# Patient Record
Born: 1962 | Race: White | Hispanic: No | ZIP: 274 | Smoking: Never smoker
Health system: Southern US, Community
[De-identification: ages and names within clinical notes are randomized; demographics above are authoritative.]

## PROBLEM LIST (undated history)

## (undated) DIAGNOSIS — K5792 Diverticulitis of intestine, part unspecified, without perforation or abscess without bleeding: Secondary | ICD-10-CM

## (undated) HISTORY — PX: ABDOMINAL HYSTERECTOMY: SHX81

## (undated) HISTORY — PX: APPENDECTOMY: SHX54

## (undated) HISTORY — PX: BACK SURGERY: SHX140

## (undated) HISTORY — PX: TOE SURGERY: SHX1073

---

## 2000-12-06 ENCOUNTER — Other Ambulatory Visit: Admission: RE | Admit: 2000-12-06 | Discharge: 2000-12-06 | Payer: Self-pay | Admitting: Family Medicine

## 2001-07-02 ENCOUNTER — Encounter: Payer: Self-pay | Admitting: Family Medicine

## 2001-07-02 ENCOUNTER — Encounter: Admission: RE | Admit: 2001-07-02 | Discharge: 2001-07-02 | Payer: Self-pay | Admitting: Family Medicine

## 2002-06-04 ENCOUNTER — Encounter: Admission: RE | Admit: 2002-06-04 | Discharge: 2002-06-04 | Payer: Self-pay | Admitting: Family Medicine

## 2002-06-04 ENCOUNTER — Encounter: Payer: Self-pay | Admitting: Family Medicine

## 2002-07-10 ENCOUNTER — Encounter: Payer: Self-pay | Admitting: Family Medicine

## 2002-07-10 ENCOUNTER — Encounter: Admission: RE | Admit: 2002-07-10 | Discharge: 2002-07-10 | Payer: Self-pay | Admitting: Family Medicine

## 2002-07-17 ENCOUNTER — Other Ambulatory Visit: Admission: RE | Admit: 2002-07-17 | Discharge: 2002-07-17 | Payer: Self-pay | Admitting: Family Medicine

## 2002-09-16 ENCOUNTER — Ambulatory Visit (HOSPITAL_BASED_OUTPATIENT_CLINIC_OR_DEPARTMENT_OTHER): Admission: RE | Admit: 2002-09-16 | Discharge: 2002-09-16 | Payer: Self-pay | Admitting: Family Medicine

## 2003-12-08 ENCOUNTER — Ambulatory Visit: Payer: Self-pay | Admitting: Family Medicine

## 2003-12-16 ENCOUNTER — Encounter: Admission: RE | Admit: 2003-12-16 | Discharge: 2003-12-16 | Payer: Self-pay | Admitting: Family Medicine

## 2004-01-22 ENCOUNTER — Ambulatory Visit: Payer: Self-pay | Admitting: Family Medicine

## 2004-01-29 ENCOUNTER — Other Ambulatory Visit: Admission: RE | Admit: 2004-01-29 | Discharge: 2004-01-29 | Payer: Self-pay | Admitting: Family Medicine

## 2004-01-29 ENCOUNTER — Ambulatory Visit: Payer: Self-pay | Admitting: Family Medicine

## 2004-03-25 ENCOUNTER — Ambulatory Visit: Payer: Self-pay | Admitting: Family Medicine

## 2004-04-08 ENCOUNTER — Encounter: Admission: RE | Admit: 2004-04-08 | Discharge: 2004-04-08 | Payer: Self-pay | Admitting: Family Medicine

## 2004-05-20 ENCOUNTER — Ambulatory Visit: Payer: Self-pay | Admitting: Family Medicine

## 2004-07-21 ENCOUNTER — Ambulatory Visit: Payer: Self-pay | Admitting: Family Medicine

## 2004-08-19 ENCOUNTER — Ambulatory Visit: Payer: Self-pay | Admitting: Internal Medicine

## 2004-09-14 ENCOUNTER — Ambulatory Visit: Payer: Self-pay | Admitting: Internal Medicine

## 2004-11-04 ENCOUNTER — Ambulatory Visit: Payer: Self-pay | Admitting: Family Medicine

## 2004-11-15 ENCOUNTER — Ambulatory Visit: Payer: Self-pay | Admitting: Family Medicine

## 2004-11-23 ENCOUNTER — Ambulatory Visit: Payer: Self-pay | Admitting: Family Medicine

## 2005-01-13 ENCOUNTER — Ambulatory Visit: Payer: Self-pay | Admitting: Family Medicine

## 2005-03-17 ENCOUNTER — Ambulatory Visit: Payer: Self-pay | Admitting: Family Medicine

## 2005-04-04 ENCOUNTER — Ambulatory Visit: Payer: Self-pay | Admitting: Family Medicine

## 2005-05-31 ENCOUNTER — Encounter: Admission: RE | Admit: 2005-05-31 | Discharge: 2005-05-31 | Payer: Self-pay | Admitting: Family Medicine

## 2005-06-20 ENCOUNTER — Encounter: Admission: RE | Admit: 2005-06-20 | Discharge: 2005-06-20 | Payer: Self-pay | Admitting: Family Medicine

## 2005-11-14 ENCOUNTER — Ambulatory Visit: Payer: Self-pay | Admitting: Family Medicine

## 2005-11-17 ENCOUNTER — Ambulatory Visit: Payer: Self-pay | Admitting: Family Medicine

## 2005-11-17 LAB — CONVERTED CEMR LAB
ALT: 16 units/L (ref 0–40)
AST: 19 units/L (ref 0–37)
Albumin: 4.1 g/dL (ref 3.5–5.2)
Alkaline Phosphatase: 47 units/L (ref 39–117)
BUN: 12 mg/dL (ref 6–23)
Basophils Absolute: 0.1 10*3/uL (ref 0.0–0.1)
Basophils Relative: 0.8 % (ref 0.0–1.0)
CO2: 25 meq/L (ref 19–32)
Calcium: 9.3 mg/dL (ref 8.4–10.5)
Chloride: 105 meq/L (ref 96–112)
Creatinine, Ser: 0.7 mg/dL (ref 0.4–1.2)
Eosinophil percent: 1.4 % (ref 0.0–5.0)
Ferritin: 9 ng/mL — ABNORMAL LOW (ref 10.0–291.0)
Free T4: 0.7 ng/dL — ABNORMAL LOW (ref 0.9–1.8)
GFR calc non Af Amer: 97 mL/min
Glomerular Filtration Rate, Af Am: 117 mL/min/{1.73_m2}
Glucose, Bld: 119 mg/dL — ABNORMAL HIGH (ref 70–99)
HCT: 37 % (ref 36.0–46.0)
Hemoglobin: 12.5 g/dL (ref 12.0–15.0)
Iron: 106 ug/dL (ref 42–145)
Lymphocytes Relative: 32.3 % (ref 12.0–46.0)
MCHC: 33.9 g/dL (ref 30.0–36.0)
MCV: 88.7 fL (ref 78.0–100.0)
Monocytes Absolute: 0.3 10*3/uL (ref 0.2–0.7)
Monocytes Relative: 3.9 % (ref 3.0–11.0)
Neutro Abs: 4.6 10*3/uL (ref 1.4–7.7)
Neutrophils Relative %: 61.6 % (ref 43.0–77.0)
Platelets: 289 10*3/uL (ref 150–400)
Potassium: 3.3 meq/L — ABNORMAL LOW (ref 3.5–5.1)
RBC: 4.17 M/uL (ref 3.87–5.11)
RDW: 12.1 % (ref 11.5–14.6)
Saturation Ratios: 31 % (ref 20.0–50.0)
Sodium: 136 meq/L (ref 135–145)
T3, Free: 3 pg/mL (ref 2.3–4.2)
TSH: 0.89 microintl units/mL (ref 0.35–5.50)
Total Bilirubin: 0.7 mg/dL (ref 0.3–1.2)
Total Protein: 7.5 g/dL (ref 6.0–8.3)
Transferrin: 244.4 mg/dL (ref >=212.0)
WBC: 7.6 10*3/uL (ref 4.5–10.5)

## 2005-11-29 ENCOUNTER — Ambulatory Visit: Payer: Self-pay | Admitting: Gastroenterology

## 2005-12-13 ENCOUNTER — Ambulatory Visit: Payer: Self-pay | Admitting: Family Medicine

## 2006-02-14 ENCOUNTER — Ambulatory Visit: Payer: Self-pay | Admitting: Family Medicine

## 2006-02-14 LAB — CONVERTED CEMR LAB
ALT: 23 units/L (ref 0–40)
AST: 23 units/L (ref 0–37)
Albumin: 3.7 g/dL (ref 3.5–5.2)
Alkaline Phosphatase: 41 units/L (ref 39–117)
Bilirubin, Direct: 0.1 mg/dL (ref 0.0–0.3)
Total Bilirubin: 0.6 mg/dL (ref 0.3–1.2)
Total Protein: 6.9 g/dL (ref 6.0–8.3)

## 2006-02-27 ENCOUNTER — Encounter: Payer: Self-pay | Admitting: Family Medicine

## 2006-02-27 ENCOUNTER — Ambulatory Visit: Payer: Self-pay | Admitting: Family Medicine

## 2006-02-27 ENCOUNTER — Other Ambulatory Visit: Admission: RE | Admit: 2006-02-27 | Discharge: 2006-02-27 | Payer: Self-pay | Admitting: Family Medicine

## 2006-05-02 ENCOUNTER — Ambulatory Visit: Payer: Self-pay | Admitting: Family Medicine

## 2006-07-10 ENCOUNTER — Ambulatory Visit: Payer: Self-pay | Admitting: Family Medicine

## 2006-07-10 DIAGNOSIS — J40 Bronchitis, not specified as acute or chronic: Secondary | ICD-10-CM | POA: Insufficient documentation

## 2006-08-15 ENCOUNTER — Encounter: Admission: RE | Admit: 2006-08-15 | Discharge: 2006-08-15 | Payer: Self-pay | Admitting: Family Medicine

## 2006-08-17 ENCOUNTER — Encounter (INDEPENDENT_AMBULATORY_CARE_PROVIDER_SITE_OTHER): Payer: Self-pay | Admitting: *Deleted

## 2006-10-17 ENCOUNTER — Ambulatory Visit: Payer: Self-pay | Admitting: Family Medicine

## 2006-10-17 DIAGNOSIS — I10 Essential (primary) hypertension: Secondary | ICD-10-CM

## 2006-10-26 ENCOUNTER — Ambulatory Visit: Payer: Self-pay | Admitting: Internal Medicine

## 2006-10-26 ENCOUNTER — Ambulatory Visit: Payer: Self-pay | Admitting: Family Medicine

## 2006-10-26 ENCOUNTER — Telehealth (INDEPENDENT_AMBULATORY_CARE_PROVIDER_SITE_OTHER): Payer: Self-pay | Admitting: *Deleted

## 2006-10-26 DIAGNOSIS — R109 Unspecified abdominal pain: Secondary | ICD-10-CM | POA: Insufficient documentation

## 2006-10-26 LAB — CONVERTED CEMR LAB
ALT: 18 units/L (ref 0–35)
AST: 16 units/L (ref 0–37)
Albumin: 3.9 g/dL (ref 3.5–5.2)
Alkaline Phosphatase: 73 units/L (ref 39–117)
Amylase: 46 units/L (ref 27–131)
Basophils Absolute: 0.1 10*3/uL (ref 0.0–0.1)
Basophils Relative: 0.6 % (ref 0.0–1.0)
Bilirubin Urine: NEGATIVE
Bilirubin Urine: NEGATIVE
Bilirubin, Direct: 0.1 mg/dL (ref 0.0–0.3)
Eosinophils Absolute: 0.1 10*3/uL (ref 0.0–0.6)
Eosinophils Relative: 1.1 % (ref 0.0–5.0)
Glucose, Urine, Semiquant: NEGATIVE
HCT: 37.2 % (ref 36.0–46.0)
Hemoglobin, Urine: NEGATIVE
Hemoglobin: 12.7 g/dL (ref 12.0–15.0)
Ketones, ur: NEGATIVE mg/dL
Ketones, urine, test strip: NEGATIVE
Leukocytes, UA: NEGATIVE
Lipase: 23 units/L (ref 11.0–59.0)
Lymphocytes Relative: 18.7 % (ref 12.0–46.0)
MCHC: 34.1 g/dL (ref 30.0–36.0)
MCV: 88.6 fL (ref 78.0–100.0)
Monocytes Absolute: 0.8 10*3/uL — ABNORMAL HIGH (ref 0.2–0.7)
Monocytes Relative: 6.5 % (ref 3.0–11.0)
Neutro Abs: 8.4 10*3/uL — ABNORMAL HIGH (ref 1.4–7.7)
Neutrophils Relative %: 73.1 % (ref 43.0–77.0)
Nitrite: NEGATIVE
Nitrite: NEGATIVE
Platelets: 344 10*3/uL (ref 150–400)
Protein, U semiquant: NEGATIVE
Protein, ur: NEGATIVE mg/dL
RBC: 4.2 M/uL (ref 3.87–5.11)
RDW: 12.3 % (ref 11.5–14.6)
Specific Gravity, Urine: 1.025
Specific Gravity, Urine: 1.007 (ref 1.005–1.03)
Total Bilirubin: 1 mg/dL (ref 0.3–1.2)
Total Protein: 7.4 g/dL (ref 6.0–8.3)
Urine Glucose: NEGATIVE mg/dL
Urobilinogen, UA: NEGATIVE
Urobilinogen, UA: 0.2 (ref 0.0–1.0)
WBC Urine, dipstick: NEGATIVE
WBC: 11.6 10*3/uL — ABNORMAL HIGH (ref 4.5–10.5)
pH: 6
pH: 6.5 (ref 5.0–8.0)

## 2006-10-27 ENCOUNTER — Encounter (INDEPENDENT_AMBULATORY_CARE_PROVIDER_SITE_OTHER): Payer: Self-pay | Admitting: Family Medicine

## 2006-11-23 IMAGING — MG MM DIGITAL DIAGNOSTIC LIMITED*R*
2 series · 2 of 2 positions shown · non-contrast
Comparison: none

[REDACTED] RIGHT
CC and MLO view(s) were taken of the right breast.

DIGITAL LIMITED RIGHT DIAGNOSTIC MAMMOGRAM:
CLINICAL DATA: Abnormal screening mammogram.
Comparison studies are dated 04-08-04 and 07-10-02.  Additional views today reveal no persistent mass
or distortion within the upper right breast.  The fibroglandular parenchymal pattern is stable.

[R MLO]
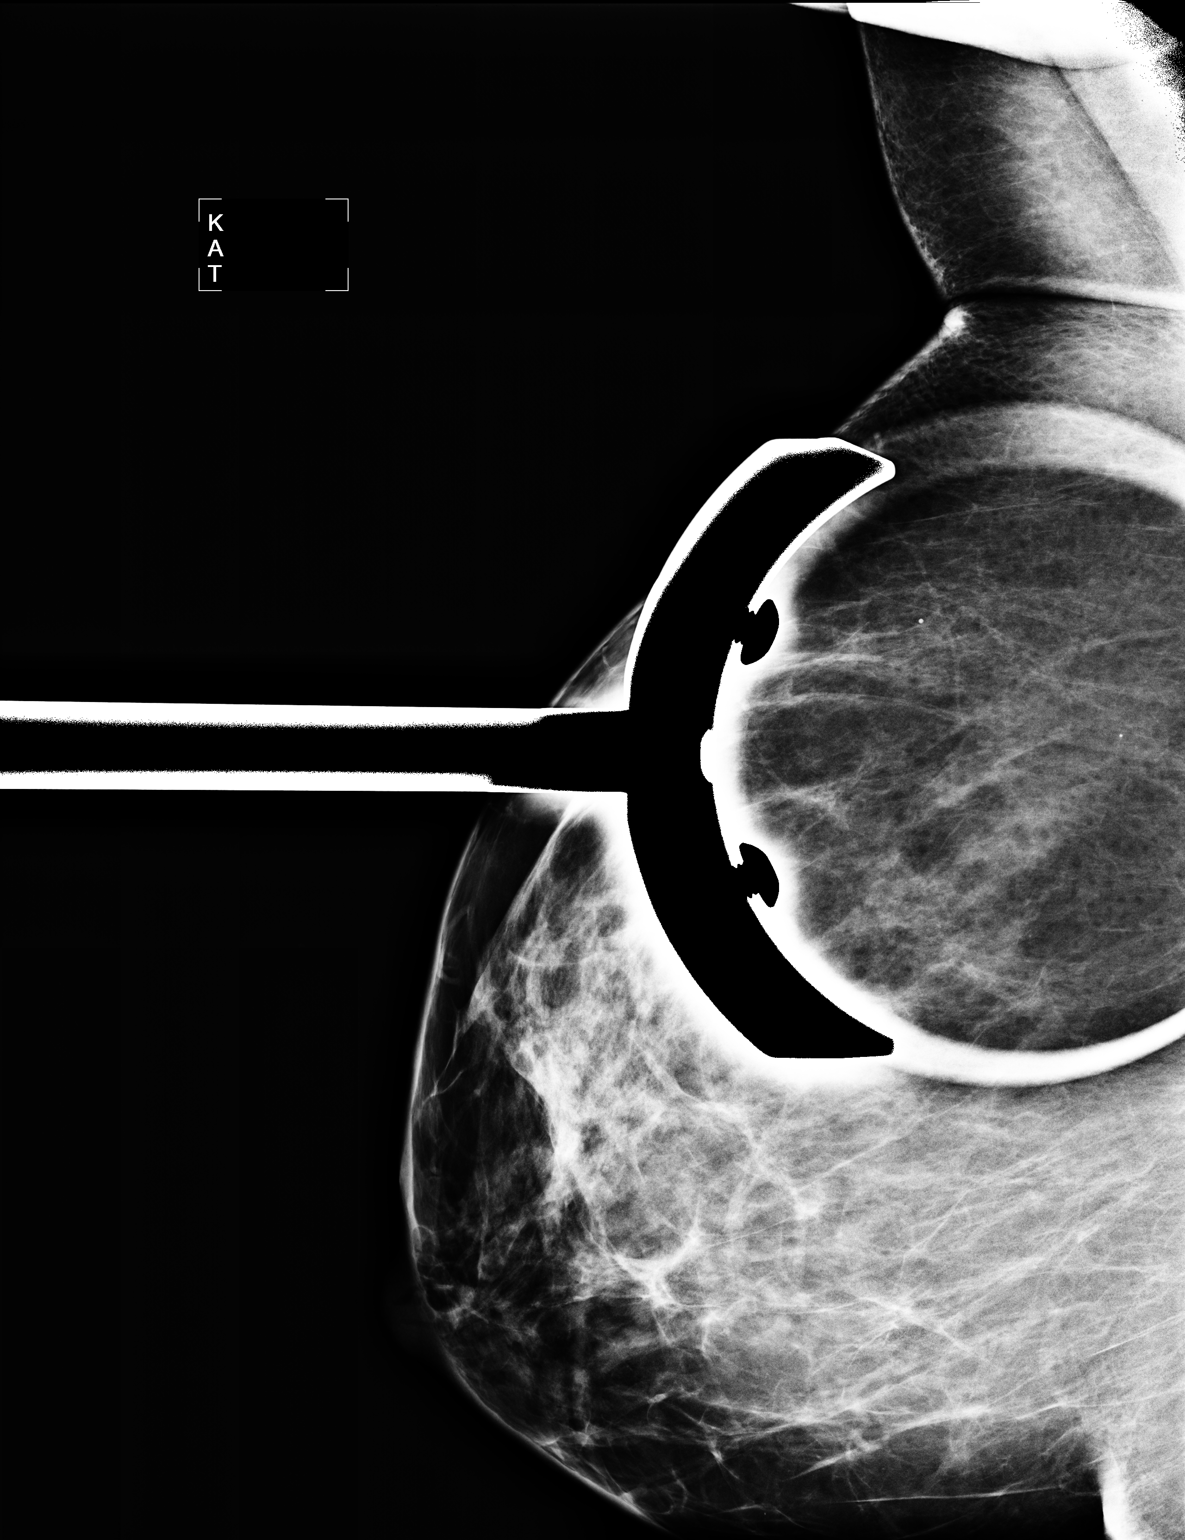

[R ML]
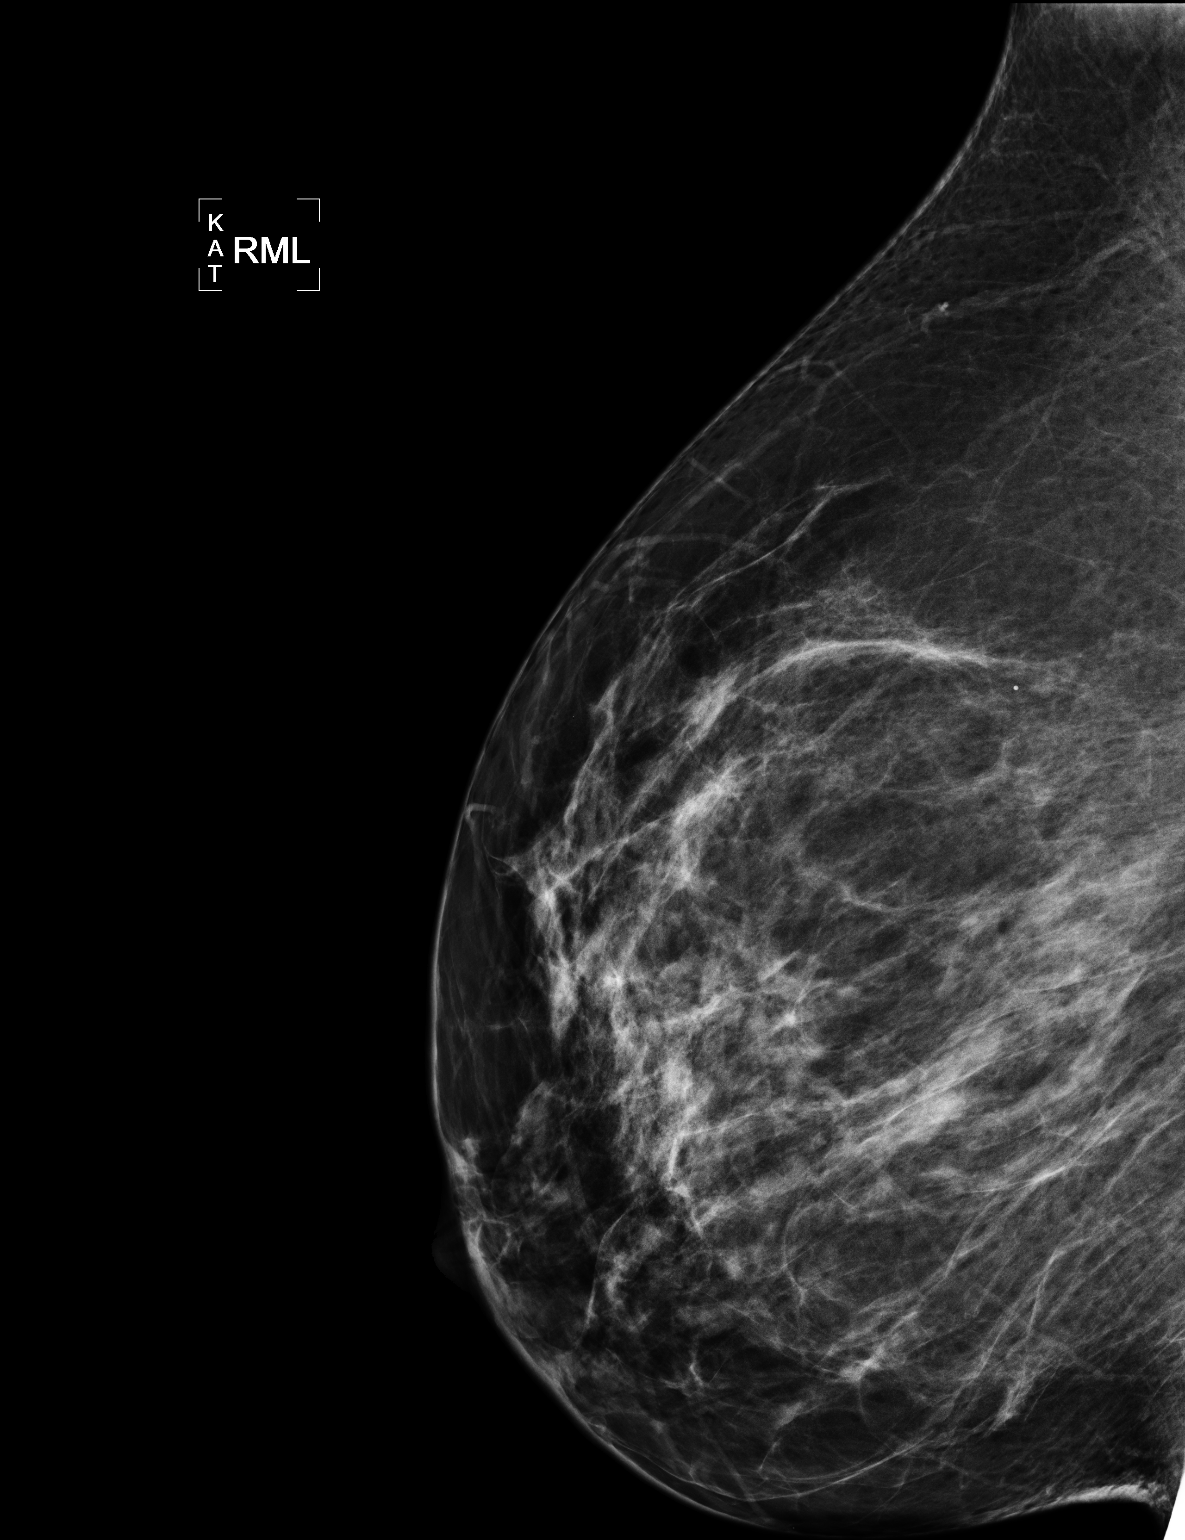

[2 of 2 positions shown; findings below may reference images not displayed]

IMPRESSION: There is no specific radiographic evidence of malignancy on the right.  Screening mammogram in one 
year is recommended.

ASSESSMENT: Negative - BI-RADS 1

Screening mammogram of both breasts in 1 year.
, THIS PROCEDURE WAS A DIGITAL MAMMOGRAM.

## 2006-11-27 ENCOUNTER — Ambulatory Visit: Payer: Self-pay | Admitting: Gastroenterology

## 2006-11-29 ENCOUNTER — Telehealth (INDEPENDENT_AMBULATORY_CARE_PROVIDER_SITE_OTHER): Payer: Self-pay | Admitting: *Deleted

## 2007-01-14 ENCOUNTER — Encounter (HOSPITAL_COMMUNITY): Payer: Self-pay | Admitting: Obstetrics and Gynecology

## 2007-01-14 ENCOUNTER — Ambulatory Visit (HOSPITAL_COMMUNITY): Admission: AD | Admit: 2007-01-14 | Discharge: 2007-01-15 | Payer: Self-pay | Admitting: Obstetrics and Gynecology

## 2007-02-13 ENCOUNTER — Ambulatory Visit: Payer: Self-pay | Admitting: Family Medicine

## 2007-02-13 DIAGNOSIS — R05 Cough: Secondary | ICD-10-CM

## 2007-02-18 ENCOUNTER — Ambulatory Visit: Payer: Self-pay | Admitting: Family Medicine

## 2007-02-18 DIAGNOSIS — M79609 Pain in unspecified limb: Secondary | ICD-10-CM | POA: Insufficient documentation

## 2007-02-19 ENCOUNTER — Ambulatory Visit: Payer: Self-pay | Admitting: Family Medicine

## 2007-02-19 ENCOUNTER — Telehealth (INDEPENDENT_AMBULATORY_CARE_PROVIDER_SITE_OTHER): Payer: Self-pay | Admitting: *Deleted

## 2007-02-20 ENCOUNTER — Encounter (INDEPENDENT_AMBULATORY_CARE_PROVIDER_SITE_OTHER): Payer: Self-pay | Admitting: *Deleted

## 2007-02-20 LAB — CONVERTED CEMR LAB: Uric Acid, Serum: 4.5 mg/dL (ref 2.4–7.0)

## 2007-03-18 DIAGNOSIS — K5730 Diverticulosis of large intestine without perforation or abscess without bleeding: Secondary | ICD-10-CM | POA: Insufficient documentation

## 2007-03-18 DIAGNOSIS — F329 Major depressive disorder, single episode, unspecified: Secondary | ICD-10-CM

## 2007-03-18 DIAGNOSIS — R519 Headache, unspecified: Secondary | ICD-10-CM | POA: Insufficient documentation

## 2007-03-18 DIAGNOSIS — F3289 Other specified depressive episodes: Secondary | ICD-10-CM | POA: Insufficient documentation

## 2007-03-18 DIAGNOSIS — R51 Headache: Secondary | ICD-10-CM

## 2007-07-05 ENCOUNTER — Telehealth (INDEPENDENT_AMBULATORY_CARE_PROVIDER_SITE_OTHER): Payer: Self-pay | Admitting: *Deleted

## 2010-02-13 ENCOUNTER — Encounter: Payer: Self-pay | Admitting: Family Medicine

## 2010-06-07 NOTE — Assessment & Plan Note (Signed)
[facility]                                 ON-CALL NOTE   NAME:[name], Beth                          MRN:          [id number]  DATE:10/27/2006                            DOB:          1962/09/29    PHONE NUMBER:  [phone].   COMPLAINT:  Lower stomach pain.   PROGRESS NOTE:  The patient has been diagnosed with diverticulitis  yesterday. She had had a CT scan but does not think there is any abscess  on it. She is not running a fever but having some nausea and some right  lower quadrant pain. She knows it is not her appendix because she has  had that removed. She was started on Ciprofloxacin and Flagyl that has  not yet been 24 hours since she started the medication. She is also  planning a hysterectomy, upcoming for a prolapsed uterus but has no  other gynecologic problems. The patient is wanting to know what she  could safely take for pain. I told her that she could take some Tylenol  but to avoid anti-inflammatories like Ibuprofen, as they can upset the  stomach further. She is going to use a warm compress on her abdomen. I  instructed her that if her pain increases or if she develops fever, she  needs to go to the emergency room for further evaluation. Otherwise,  call back if any questions and if she is not improving by Monday, she  will definitely call Dr. Laqueta Linden office.     [name], MD  Electronically Signed    MAT/MedQ  DD: 10/27/2006  DT: 10/27/2006  Job #: [id number]   cc:   Leanne Chang, M.D.

## 2010-06-07 NOTE — Assessment & Plan Note (Signed)
[facility]                         GASTROENTEROLOGY OFFICE NOTE   NAME:Beth Velez, Beth Velez                        MRN:          [id number]  DATE:11/27/2006                            DOB:          06/11/62    PROBLEM:  Abdominal pain.   Ms. Bove has returned for evaluation of right lower quadrant pain.  Approximately three weeks ago, she was seen at [facility] for  severe right lower quadrant pain.  CT demonstrated changes consistent  with acute diverticulitis.  She was treated with Cipro and Flagyl with  relief of her pain over several days.  She now reports erratic bowels  with episodes of diarrhea alternating with constipation.  She has seen a  small amount of blood as well.  She is status post appendectomy.  She  has no prior lower GI complaints.  She was seen approximately one year  ago in this office for left upper quadrant but failed to follow up.  This has basically resolved.   MEDICATIONS:  Protonix, lisinopril, Celexa, Lamictal, Lipitor, and  Imitrex.   She has no allergies.   PHYSICAL EXAMINATION:  Pulse 66, blood pressure 100/60, weight 202.  HEENT: EOMI.  PERRLA.  Sclerae are anicteric.  Conjunctivae are pink.  NECK:  Supple without thyromegaly, adenopathy or carotid bruits.  CHEST:  Clear to auscultation and percussion without adventitious  sounds.  CARDIAC:  Regular rhythm; normal S1 S2.  There are no murmurs, gallops  or rubs.  ABDOMEN:  Bowel sounds are normoactive.  Abdomen is soft, nontender and  nondistended.  There are no abdominal masses, tenderness, splenic  enlargement or hepatomegaly.  EXTREMITIES:  Full range of motion.  No cyanosis, clubbing or edema.  RECTAL:  Deferred.   CT scan on October 26, 2006 demonstrated mild pericolonic inflammatory  changes.  This is felt to represent an early diverticulitis.   IMPRESSION:  Questionable history of acute diverticulitis.  Other forms  of colitis must be considered,  including Crohn's disease.   RECOMMENDATION:  Colonoscopy.     Barbette Hair. Arlyce Dice, MD,FACG  Electronically Signed    RDK/MedQ  DD: 11/27/2006  DT: 11/28/2006  Job #: [id number]   cc:   Lelon Perla, DO

## 2010-06-07 NOTE — Op Note (Signed)
Beth Velez, SPIKE                 ACCOUNT NO.:  [id number]   MEDICAL RECORD NO.:  [id number]          PATIENT TYPE:  OIB   LOCATION:  9316                          FACILITY:  [facility]   PHYSICIAN:  Zelphia Cairo, MD    DATE OF BIRTH:  1962/07/01   DATE OF PROCEDURE:  01/14/2007  DATE OF DISCHARGE:  01/15/2007                               OPERATIVE REPORT   PREOPERATIVE DIAGNOSES:  1. Dysmenorrhea.  2. Uterine prolapse.   PREOPERATIVE DIAGNOSES:  1. Dysmenorrhea.  2. Uterine prolapse.   PROCEDURE:  Laparoscopic-assisted vaginal hysterectomy with bilateral  salpingo-oophorectomy.   SURGEON:  Zelphia Cairo, M.D.   ASSISTANT:  Duke Salvia. Marcelle Overlie, M.D.   ESTIMATED BLOOD LOSS:  250 cc.   URINE OUTPUT:  300 cc.   ANESTHESIA:  General.   SPECIMENS:  Uterus, cervix, bilateral tubes and ovaries to pathology.   COMPLICATIONS:  None.   CONDITION:  Patient stable, transferred to recovery room.   PROCEDURE:  The patient is taken to the operating room where general  anesthesia was obtained.  She was prepped and draped in a sterile  fashion.  A Foley catheter was inserted sterilely.  A bivalve speculum  was placed in the vagina.  A single-tooth tenaculum was placed on the  anterior lip of the cervix and a Hulka uterine manipulator was placed on  the cervix.  The tenaculum and speculum were then removed.   A small infraumbilical skin incision was made with the scalpel, and this  was extended to the fascia bluntly using Kelly clamp.  An optical trocar  was then used to enter the peritoneal cavity under direct visualization.  Once intraperitoneal placement was confirmed, CO2 was turned on, and the  abdomen and pelvis were infiltrated.  The patient was then placed in  Trendelenburg position.  A small suprapubic incision was made with a  scalpel, and a 5 mm trocar was inserted under direct visualization.  A  blunt probe was then placed.  The bowel was swept  out of the posterior  cul  de sac.  The bilateral tubes and ovaries were inspected.  An  atraumatic grasper was then used to grasp the right adnexa.  The right  ureter was visualized and found to be clear of our operating field.  The  right utero-ovarian ligament was cauterized and cut using the gyrus.  Serial bites with the gyrus were continued along the level of the  mesosalpinx and the broad ligament adjacent to the uterus.  The round  ligament was cauterized using the gyrus and cut.  This procedure was  repeated on the left adnexa.  Excellent hemostasis was noted  bilaterally, and our attention was then turned to the vagina.  Hulka  clamp was removed.   The weighted speculum was placed in the posterior vagina.  A Deaver was  placed anteriorly.  The cervix was grasped with a tenaculum.  A  circumferential incision was made around the cervix using the Bovie.  The posterior cul de sac was then entered sharply using Mayo scissors.  Once intraperitoneal entry was confirmed, a long  weighted speculum was  placed in the posterior cul de sac.  Bilateral uterosacral ligaments  were grasped with the Ligasure, cauterized, and cut with Mayo scissors.  Hemostasis was assured bilaterally.  The anterior cul de sac was then  tented upwards and entered with Metzenbaum scissors.  A Deaver retractor  was then placed in the anterior cul de sac.  Bilateral cardinal  ligaments and uterine arteries were cauterized with the Ligasure, cut  with Mayo scissors.  Hemostasis was assured bilaterally.  The fundus of  the uterus was then grasped using a tenaculum and delivered through the  posterior cul de sac.  The remaining broad ligament attachments were  grasped bilaterally with curved Heaney clamps, cut with Mayo scissors.  These were suture ligated using 0 Vicryl.  Once hemostasis was assured,  a 0 Vicryl was used to reapproximate the posterior vaginal cuff to the  posterior peritoneum in a running, locked fashion.  The vaginal cuff  was  then closed using serial figure-of-eight suture.  The vaginal cuff was  well approximated and hemostatic.  The cuff was felt to be well-  supported and at the level of the ischial spine; therefore, sacrospinous  ligament suspension was not performed.  Our attention was then returned  to the abdomen.   The CO2 was turned back on, and an inspection of the pelvic cavity was  performed.  The pelvis was irrigated.  All pedicles were found to be  hemostatic.  All instruments and trocars were removed from the abdomen.  The fascia of the infraumbilical skin incision was closed using Vicryl,  and the skin was reapproximated using 3-0 Vicryl.  The patient tolerated  the procedure well.  Sponge, lap, needle, and instrument counts were  correct x2.  She was taken to the recovery room in stable condition.      Zelphia Cairo, MD  Electronically Signed     GA/MEDQ  D:  01/15/2007  T:  01/15/2007  Job:  [id number]

## 2010-06-10 NOTE — Assessment & Plan Note (Signed)
[facility]                           GASTROENTEROLOGY OFFICE NOTE   NAME:Beth Velez, Beth Velez                        MRN:          [id number]  DATE:11/29/2005                            DOB:          08-12-1962    REASON FOR CONSULTATION:  Abdominal pain.   HISTORY OF PRESENT ILLNESS:  Beth Velez is a pleasant 48 year old white  female referred through the courtesy of Dr. Laury Axon for evaluation.  Over the  last year, she has been complaining of intermittent, sharp, left upper  quadrant pain.  Pain is intense, though it usually lasts minutes at a time.  It tends to be postprandially.  It does not awaken her.  She denies nausea  or vomiting.  She does have pyrosis which is well controlled with Protonix.  She has had no gastric irritants including nonsteroidals.  She also has very  intermittent crampy abdominal pain with diarrhea.  This is not related to  her upper abdominal pain.   PAST MEDICAL HISTORY:  1. Depression.  2. Chronic headaches.  3. Hypercholesterolemia.  4. Status post tubal ligation and appendectomy.  5. History of noncontributory.   MEDICATIONS:  1. Protonix.  2. Effexor.  3. Vytorin.   ALLERGIES:  No known drug allergies.   SOCIAL HISTORY:  She neither smokes nor drinks.  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   REVIEW OF SYSTEMS:  Reviewed and is negative.   PHYSICAL EXAMINATION:  VITAL SIGNS:  Pulse 76, blood pressure 130/80, weight  199.  HEENT: EOMI. PERRLA. Sclerae are anicteric.  Conjunctivae are pink.  NECK:  Supple without thyromegaly, adenopathy or carotid bruits.  CHEST:  Clear to auscultation and percussion without adventitious sounds.  CARDIAC:  A 2/6 early systolic murmur in the left sternal border.  ABDOMEN:  Bowel sounds are normoactive.  Abdomen is soft, non-tender and non-  distended.  There are no abdominal masses, tenderness, splenic enlargement  or hepatomegaly.  EXTREMITIES:  Full range of  motion.  No cyanosis, clubbing or edema.  RECTAL:  Deferred.   IMPRESSION:  1. Intermittent, sharp, left upper quadrant pain.  This pain seems to be      more spastic.  An active gastric lesion is less likely in the face of      PPI therapy and the longevity of her symptoms.  Though, I believe      examination is indicated.  2. Gastroesophageal reflux disease.   RECOMMENDATION:  1. Upper endoscopy.  2. Continue Protonix.  3. NuLev 0.25 mg sublingual q.4 h p.r.n. pain.     Barbette Hair. Arlyce Dice, MD,FACG  Electronically Signed    RDK/MedQ  DD: 11/29/2005  DT: 11/29/2005  Job #: [id number]

## 2010-06-10 NOTE — Letter (Signed)
November 29, 2005    Lelon Perla, DO  [address] [place], [place] [address]   RE:  KIANDRA, SANGUINETTI  MRN:  [id number]  /  DOB:  04-Nov-1962   Dear Dr. Laury Axon:   Upon your kind referral, I had the pleasure of evaluating your patient and I  am pleased to offer my findings.  I saw Lurie Mullane. Trenkamp in the office today.  Enclosed is a copy of my progress note that details my findings and  recommendations.   Thank you for the opportunity to participate in your patient's care.    Sincerely,      Barbette Hair. Arlyce Dice, MD,FACG  Electronically Signed    RDK/MedQ  DD: 11/29/2005  DT: 11/29/2005  Job #: [id number]

## 2010-06-10 NOTE — Letter (Signed)
November 29, 2005    Margy Sumler  [address]  [place], [place] [address]   RE:  QUINTESSA, SIMMERMAN  MRN:  [id number]  /  DOB:  03/22/62   Dear Ms. Burget:   It is my pleasure to have treated you recently as a new patient in my  office.  I appreciate your confidence and the opportunity to participate in  your care.   Since I do have a busy inpatient endoscopy schedule and office schedule, my  office hours vary weekly.  I am, however, available for emergency calls  every day through my office.  If I cannot promptly meet an urgent office  appointment, another one of our gastroenterologists will be able to assist  you.   My well-trained staff are prepared to help you at all times.  For  emergencies after office hours, a physician from our gastroenterology  section is always available through my 24-hour answering service.   While you are under my care, I encourage discussion of your questions and  concerns, and I will be happy to return your calls as soon as I am  available.   Once again, I welcome you as a new patient and I look forward to a happy and  healthy relationship.    Sincerely,      Barbette Hair. Arlyce Dice, MD,FACG  Electronically Signed    RDK/MedQ  DD: 11/29/2005  DT: 11/29/2005  Job #: [id number]

## 2010-10-28 LAB — CBC
HCT: 27.5 — ABNORMAL LOW
HCT: 34 — ABNORMAL LOW
Hemoglobin: 11.7 — ABNORMAL LOW
Hemoglobin: 9.6 — ABNORMAL LOW
MCHC: 34.4
MCHC: 35
MCV: 89
MCV: 89.3
Platelets: 282
Platelets: 325
RBC: 3.09 — ABNORMAL LOW
RBC: 3.82 — ABNORMAL LOW
RDW: 12.8
RDW: 12.9
WBC: 11 — ABNORMAL HIGH
WBC: 8.8

## 2010-10-28 LAB — TYPE AND SCREEN
ABO/RH(D): A POS
Antibody Screen: NEGATIVE

## 2010-10-28 LAB — ABO/RH: ABO/RH(D): A POS

## 2016-02-11 ENCOUNTER — Encounter (HOSPITAL_BASED_OUTPATIENT_CLINIC_OR_DEPARTMENT_OTHER): Payer: Self-pay

## 2016-02-11 ENCOUNTER — Emergency Department (HOSPITAL_BASED_OUTPATIENT_CLINIC_OR_DEPARTMENT_OTHER)
Admission: EM | Admit: 2016-02-11 | Discharge: 2016-02-11 | Disposition: A | Discharge status: Home / Self Care | Payer: Medicaid Other | Attending: Dermatology | Admitting: Dermatology

## 2016-02-11 DIAGNOSIS — Z79899 Other long term (current) drug therapy: Secondary | ICD-10-CM | POA: Insufficient documentation

## 2016-02-11 DIAGNOSIS — Z5321 Procedure and treatment not carried out due to patient leaving prior to being seen by health care provider: Secondary | ICD-10-CM | POA: Insufficient documentation

## 2016-02-11 DIAGNOSIS — R1030 Lower abdominal pain, unspecified: Secondary | ICD-10-CM | POA: Insufficient documentation

## 2016-02-11 DIAGNOSIS — R109 Unspecified abdominal pain: Secondary | ICD-10-CM | POA: Diagnosis present

## 2016-02-11 HISTORY — DX: Diverticulitis of intestine, part unspecified, without perforation or abscess without bleeding: K57.92

## 2016-02-11 NOTE — ED Triage Notes (Signed)
C/o lower abd pain x today-states feels like diverticulitis-denies n/v/d-NAD-steady gait

## 2019-07-06 ENCOUNTER — Encounter (HOSPITAL_BASED_OUTPATIENT_CLINIC_OR_DEPARTMENT_OTHER): Payer: Self-pay

## 2019-07-06 ENCOUNTER — Emergency Department (HOSPITAL_BASED_OUTPATIENT_CLINIC_OR_DEPARTMENT_OTHER)
Admission: EM | Admit: 2019-07-06 | Discharge: 2019-07-06 | Discharge status: Against Medical Advice | Payer: Medicare HMO | Attending: Emergency Medicine | Admitting: Emergency Medicine

## 2019-07-06 ENCOUNTER — Other Ambulatory Visit: Payer: Self-pay

## 2019-07-06 ENCOUNTER — Emergency Department (HOSPITAL_BASED_OUTPATIENT_CLINIC_OR_DEPARTMENT_OTHER): Payer: Medicare HMO

## 2019-07-06 DIAGNOSIS — Z5321 Procedure and treatment not carried out due to patient leaving prior to being seen by health care provider: Secondary | ICD-10-CM | POA: Diagnosis not present

## 2019-07-06 DIAGNOSIS — R0602 Shortness of breath: Secondary | ICD-10-CM | POA: Diagnosis not present

## 2019-07-06 LAB — BASIC METABOLIC PANEL
Anion gap: 11 (ref 5–15)
BUN: 12 mg/dL (ref 6–20)
CO2: 23 mmol/L (ref 22–32)
Calcium: 9.2 mg/dL (ref 8.9–10.3)
Chloride: 103 mmol/L (ref 98–111)
Creatinine, Ser: 0.86 mg/dL (ref 0.44–1.00)
GFR calc Af Amer: >60 mL/min (ref >60)
GFR calc non Af Amer: >60 mL/min (ref >60)
Glucose, Bld: 96 mg/dL (ref 70–99)
Potassium: 3.4 mmol/L — ABNORMAL LOW (ref 3.5–5.1)
Sodium: 137 mmol/L (ref 135–145)

## 2019-07-06 LAB — CBC
HCT: 37.6 % (ref 36.0–46.0)
Hemoglobin: 12.6 g/dL (ref 12.0–15.0)
MCH: 30.5 pg (ref 26.0–34.0)
MCHC: 33.5 g/dL (ref 30.0–36.0)
MCV: 91 fL (ref 80.0–100.0)
Platelets: 346 10*3/uL (ref 150–400)
RBC: 4.13 MIL/uL (ref 3.87–5.11)
RDW: 12.6 % (ref 11.5–15.5)
WBC: 7.8 10*3/uL (ref 4.0–10.5)
nRBC: 0 % (ref 0.0–0.2)

## 2019-07-06 LAB — TROPONIN I (HIGH SENSITIVITY): Troponin I (High Sensitivity): 2 ng/L (ref <18)

## 2019-07-06 NOTE — ED Notes (Signed)
Per registration, pt left without being seen after triage.

## 2019-07-06 NOTE — ED Triage Notes (Signed)
Pt arrives with c/o SOB since Wednesday reports falling earlier that week, states she has been fatigued and feeling bloated, with some pain in her left upper chest.

## 2019-07-14 ENCOUNTER — Other Ambulatory Visit: Payer: Self-pay

## 2019-07-14 ENCOUNTER — Encounter (HOSPITAL_BASED_OUTPATIENT_CLINIC_OR_DEPARTMENT_OTHER): Payer: Self-pay | Admitting: *Deleted

## 2019-07-14 ENCOUNTER — Emergency Department (HOSPITAL_BASED_OUTPATIENT_CLINIC_OR_DEPARTMENT_OTHER): Payer: Medicare HMO

## 2019-07-14 ENCOUNTER — Emergency Department (HOSPITAL_BASED_OUTPATIENT_CLINIC_OR_DEPARTMENT_OTHER)
Admission: EM | Admit: 2019-07-14 | Discharge: 2019-07-14 | Disposition: A | Discharge status: Home / Self Care | Payer: Medicare HMO | Attending: Emergency Medicine | Admitting: Emergency Medicine

## 2019-07-14 DIAGNOSIS — I10 Essential (primary) hypertension: Secondary | ICD-10-CM | POA: Diagnosis not present

## 2019-07-14 DIAGNOSIS — R5383 Other fatigue: Secondary | ICD-10-CM | POA: Diagnosis present

## 2019-07-14 DIAGNOSIS — R0602 Shortness of breath: Secondary | ICD-10-CM

## 2019-07-14 DIAGNOSIS — E876 Hypokalemia: Secondary | ICD-10-CM | POA: Insufficient documentation

## 2019-07-14 DIAGNOSIS — R0789 Other chest pain: Secondary | ICD-10-CM

## 2019-07-14 LAB — BASIC METABOLIC PANEL
Anion gap: 10 (ref 5–15)
BUN: 10 mg/dL (ref 6–20)
CO2: 24 mmol/L (ref 22–32)
Calcium: 9.4 mg/dL (ref 8.9–10.3)
Chloride: 105 mmol/L (ref 98–111)
Creatinine, Ser: 0.77 mg/dL (ref 0.44–1.00)
GFR calc Af Amer: >60 mL/min (ref >60)
GFR calc non Af Amer: >60 mL/min (ref >60)
Glucose, Bld: 125 mg/dL — ABNORMAL HIGH (ref 70–99)
Potassium: 3 mmol/L — ABNORMAL LOW (ref 3.5–5.1)
Sodium: 139 mmol/L (ref 135–145)

## 2019-07-14 LAB — CBC
HCT: 39.2 % (ref 36.0–46.0)
Hemoglobin: 12.9 g/dL (ref 12.0–15.0)
MCH: 30.5 pg (ref 26.0–34.0)
MCHC: 32.9 g/dL (ref 30.0–36.0)
MCV: 92.7 fL (ref 80.0–100.0)
Platelets: 355 10*3/uL (ref 150–400)
RBC: 4.23 MIL/uL (ref 3.87–5.11)
RDW: 12.5 % (ref 11.5–15.5)
WBC: 6.7 10*3/uL (ref 4.0–10.5)
nRBC: 0 % (ref 0.0–0.2)

## 2019-07-14 LAB — TROPONIN I (HIGH SENSITIVITY): Troponin I (High Sensitivity): 2 ng/L (ref <18)

## 2019-07-14 LAB — D-DIMER, QUANTITATIVE: D-Dimer, Quant: 0.31 ug/mL-FEU (ref 0.00–0.50)

## 2019-07-14 MED ORDER — POTASSIUM CHLORIDE CRYS ER 20 MEQ PO TBCR
40.0000 meq | EXTENDED_RELEASE_TABLET | Freq: Once | ORAL | Status: AC
  Administered 2019-07-14: 40 meq via ORAL
  Filled 2019-07-14: qty 2

## 2019-07-14 NOTE — Discharge Instructions (Addendum)
Your work up today is reassuring. Your labs show a mild hypokalemia, you were treated with a potassium pill in the ER. Your chest x-ray looks good. Your EKG shows non specific changes in general, unchanged from your last ER visit. Recommend follow up with cardiology for further work up, referral given. Return to the ER for any worsening or concerning symptoms.

## 2019-07-14 NOTE — ED Provider Notes (Addendum)
[facility] EMERGENCY DEPARTMENT Provider Note   CSN: [id number] Arrival date & time: 07/14/19  1013     History Chief Complaint  Patient presents with  . Shortness of Breath    Beth Velez is a 57 y.o. female.  57yo female with complaint of fatigue, shortness of breath, chest pain, abdominal discomfort.  Fatigue onset early June, thought possibly related to depression, no changes to medication for depression.  6/8 with horrible SHOB, intermittent  Chest pain onset around 6/11 aching mid sternal to left side chest  Saw PCP 6/15 with left breast pain- aching, worse with wearing a bra, no longer having pain in the breast. Was scheduled for a mammogram today, canceled due to coming to the ER.   Abdominal pain, worse with irritation from waist band on pants.  Had a COVID test, resulted today and was negative.  Felt like had a heart attack 2 days ago 07/12/19, looked up heart attack symptoms in women which prompted ER visit.  Chest pain and SHOB not as bad today. Chest pain is constant, severity waxes and wanes, aching in nature, worse with nothing. Does not improve with taking off bra or pants (felt restrictive), not worse with exertion, radiates to left armpit/neck (for the past few days).  No prior heart testing. High cholesterol, history of HTN this year, reports due to stress, taking amlodipine. No history of diabetes. Father with MI, not diagnosed until in his 24s, no significant heart history prior to age 43.        Past Medical History:  Diagnosis Date  . Diverticulitis     Patient Active Problem List   Diagnosis Date Noted  . DEPRESSION 03/18/2007  . DIVERTICULAR DISEASE 03/18/2007  . HEADACHE, CHRONIC 03/18/2007  . FOOT PAIN, LEFT 02/18/2007  . COUGH 02/13/2007  . ABDOMINAL PAIN, ACUTE 10/26/2006  . HYPERTENSION 10/17/2006  . BRONCHITIS NOS 07/10/2006    Past Surgical History:  Procedure Laterality Date  . ABDOMINAL HYSTERECTOMY    .  APPENDECTOMY    . BACK SURGERY       OB History    Gravida  2   Para  2   Term      Preterm      AB      Living        SAB      TAB      Ectopic      Multiple      Live Births              History reviewed. No pertinent family history.  Social History   Tobacco Use  . Smoking status: Never Smoker  . Smokeless tobacco: Never Used  Substance Use Topics  . Alcohol use: No  . Drug use: No    Home Medications Prior to Admission medications   Medication Sig Start Date End Date Taking? Authorizing Provider  clonazePAM (KLONOPIN) 1 MG tablet Take 1 mg by mouth 2 (two) times daily as needed for anxiety.   Yes [provider]  Desvenlafaxine Succinate (PRISTIQ PO) Take by mouth.   Yes [provider]  Estradiol (ESTRACE PO) Take by mouth.   Yes [provider]  LamoTRIgine (LAMICTAL PO) Take by mouth.   Yes [provider]  Pantoprazole Sodium (PROTONIX PO) Take by mouth.   Yes [provider]    Allergies    Patient has no known allergies.  Review of Systems   Review of Systems  Constitutional: Positive  for fatigue. Negative for diaphoresis and fever.  Respiratory: Positive for shortness of breath.   Cardiovascular: Positive for chest pain. Negative for palpitations and leg swelling.  Gastrointestinal: Positive for abdominal pain and nausea. Negative for vomiting.  Genitourinary: Negative for dysuria.  Musculoskeletal: Positive for arthralgias.  Skin: Negative for rash and wound.  Neurological: Negative for weakness.  Hematological: Negative for adenopathy.  Psychiatric/Behavioral: Negative for confusion.  All other systems reviewed and are negative.   Physical Exam Updated Vital Signs BP (!) 129/92 (BP Location: Right Arm)   Pulse 79   Temp 98.3 F (36.8 C) (Oral)   Resp 16   SpO2 100%   Physical Exam Vitals and nursing note reviewed.  Constitutional:      General: She is not in acute distress.     Appearance: She is well-developed. She is not diaphoretic.  HENT:     Head: Normocephalic and atraumatic.  Cardiovascular:     Rate and Rhythm: Normal rate and regular rhythm.  Pulmonary:     Effort: Pulmonary effort is normal.     Breath sounds: Normal breath sounds. No decreased breath sounds.  Abdominal:     Palpations: Abdomen is soft.     Tenderness: There is no abdominal tenderness.  Musculoskeletal:     Right lower leg: No tenderness.     Left lower leg: No tenderness.  Neurological:     Mental Status: She is alert and oriented to person, place, and time.  Psychiatric:        Behavior: Behavior normal.     ED Results / Procedures / Treatments   Labs (all labs ordered are listed, but only abnormal results are displayed) Labs Reviewed  BASIC METABOLIC PANEL - Abnormal; Notable for the following components:      Result Value   Potassium 3.0 (*)    Glucose, Bld 125 (*)    All other components within normal limits  CBC  D-DIMER, QUANTITATIVE (NOT AT [facility])  TROPONIN I (HIGH SENSITIVITY)    EKG None  Radiology No results found.  Procedures Procedures (including critical care time)  Medications Ordered in ED Medications  potassium chloride SA (KLOR-CON) CR tablet 40 mEq (40 mEq Oral Given 07/14/19 1232)    ED Course  I have reviewed the triage vital signs and the nursing notes.  Pertinent labs & imaging results that were available during my care of the patient were reviewed by me and considered in my medical decision making (see chart for details).  Clinical Course as of Jul 28 1443  Mon Jul 14, 2019  1123 ECG per my interpretation with NSR, similar repolarization abnormalities as noted on 6/14 ecg, no STEMI    [MT]  1656 57 year old female with complaints as listed above.  On exam, patient is well-appearing, she is mildly tachycardic with clear lung sounds and abdomen is soft and nontender, no lower extremity edema. CBC within normal limits, BMP with mild  hypokalemia with potassium 3.0, treated with oral potassium prior to discharge.  Initial troponin is 2, no significant EKG change from EKG obtained 1 week ago, with chest discomfort ongoing for greater than 1 week, doubt ACS however EKG with early repolarization and no prior EKGs before this month available for comparison, plan is to refer to cardiology for further work-up, given strict return to ER precautions.  D-dimer is negative, doubt PE. Case discussed with Dr. Langston Masker, ER attending, agrees with plan of care.    [LM]    Clinical Course User Index [  LM] Jeannie Fend, PA-C [MT] Renaye Rakers Kermit Balo, MD   MDM Rules/Calculators/A&P                          Final Clinical Impression(s) / ED Diagnoses Final diagnoses:  Shortness of breath  Discomfort in chest  Hypokalemia    Rx / DC Orders ED Discharge Orders    None       Alden Hipp 07/14/19 1301    Terald Sleeper, MD 07/14/19 1746    Jeannie Fend, PA-C 07/28/19 1445    Terald Sleeper, MD 07/29/19 1306

## 2019-07-14 NOTE — ED Triage Notes (Signed)
Patient stated that she was here on the 13th and left after being triage but was not seen by EDP.  Shortness of breath and chest pain since June the 8th.  Patient also stated that she had a heart attack 2 days ago (self diagnosed), extreme pain on her left chest pain, radiating to her left armpit and extreme fatigue.

## 2019-07-25 ENCOUNTER — Ambulatory Visit: Payer: Medicare HMO | Admitting: Cardiology

## 2019-08-22 ENCOUNTER — Ambulatory Visit: Payer: Medicare HMO | Admitting: Cardiology

## 2019-09-05 ENCOUNTER — Encounter: Payer: Self-pay | Admitting: General Practice

## 2021-04-06 ENCOUNTER — Observation Stay (HOSPITAL_BASED_OUTPATIENT_CLINIC_OR_DEPARTMENT_OTHER)
Admission: EM | Admit: 2021-04-06 | Discharge: 2021-04-07 | Disposition: A | Discharge status: Home / Self Care | Payer: Medicare HMO | Attending: Family Medicine | Admitting: Family Medicine

## 2021-04-06 ENCOUNTER — Observation Stay (HOSPITAL_COMMUNITY): Payer: Medicare HMO

## 2021-04-06 ENCOUNTER — Other Ambulatory Visit: Payer: Self-pay

## 2021-04-06 ENCOUNTER — Emergency Department (HOSPITAL_BASED_OUTPATIENT_CLINIC_OR_DEPARTMENT_OTHER): Payer: Medicare HMO

## 2021-04-06 ENCOUNTER — Encounter (HOSPITAL_BASED_OUTPATIENT_CLINIC_OR_DEPARTMENT_OTHER): Payer: Self-pay | Admitting: Emergency Medicine

## 2021-04-06 DIAGNOSIS — H5340 Unspecified visual field defects: Secondary | ICD-10-CM | POA: Diagnosis present

## 2021-04-06 DIAGNOSIS — G459 Transient cerebral ischemic attack, unspecified: Secondary | ICD-10-CM | POA: Diagnosis not present

## 2021-04-06 DIAGNOSIS — I10 Essential (primary) hypertension: Secondary | ICD-10-CM | POA: Diagnosis not present

## 2021-04-06 DIAGNOSIS — H532 Diplopia: Secondary | ICD-10-CM | POA: Diagnosis not present

## 2021-04-06 DIAGNOSIS — R42 Dizziness and giddiness: Secondary | ICD-10-CM | POA: Insufficient documentation

## 2021-04-06 DIAGNOSIS — Z79899 Other long term (current) drug therapy: Secondary | ICD-10-CM | POA: Diagnosis not present

## 2021-04-06 LAB — CBC WITH DIFFERENTIAL/PLATELET
Abs Immature Granulocytes: 0.02 10*3/uL (ref 0.00–0.07)
Basophils Absolute: 0.1 10*3/uL (ref 0.0–0.1)
Basophils Relative: 1 %
Eosinophils Absolute: 0.1 10*3/uL (ref 0.0–0.5)
Eosinophils Relative: 2 %
HCT: 40.6 % (ref 36.0–46.0)
Hemoglobin: 13.4 g/dL (ref 12.0–15.0)
Immature Granulocytes: 0 %
Lymphocytes Relative: 39 %
Lymphs Abs: 2.5 10*3/uL (ref 0.7–4.0)
MCH: 30 pg (ref 26.0–34.0)
MCHC: 33 g/dL (ref 30.0–36.0)
MCV: 90.8 fL (ref 80.0–100.0)
Monocytes Absolute: 0.4 10*3/uL (ref 0.1–1.0)
Monocytes Relative: 6 %
Neutro Abs: 3.3 10*3/uL (ref 1.7–7.7)
Neutrophils Relative %: 52 %
Platelets: 402 10*3/uL — ABNORMAL HIGH (ref 150–400)
RBC: 4.47 MIL/uL (ref 3.87–5.11)
RDW: 12.8 % (ref 11.5–15.5)
WBC: 6.4 10*3/uL (ref 4.0–10.5)
nRBC: 0 % (ref 0.0–0.2)

## 2021-04-06 LAB — COMPREHENSIVE METABOLIC PANEL
ALT: 17 U/L (ref 0–44)
AST: 18 U/L (ref 15–41)
Albumin: 4.4 g/dL (ref 3.5–5.0)
Alkaline Phosphatase: 52 U/L (ref 38–126)
Anion gap: 10 (ref 5–15)
BUN: 9 mg/dL (ref 6–20)
CO2: 23 mmol/L (ref 22–32)
Calcium: 9.6 mg/dL (ref 8.9–10.3)
Chloride: 104 mmol/L (ref 98–111)
Creatinine, Ser: 0.82 mg/dL (ref 0.44–1.00)
GFR, Estimated: >60 mL/min (ref >60)
Glucose, Bld: 97 mg/dL (ref 70–99)
Potassium: 3.6 mmol/L (ref 3.5–5.1)
Sodium: 137 mmol/L (ref 135–145)
Total Bilirubin: 0.5 mg/dL (ref 0.3–1.2)
Total Protein: 7.7 g/dL (ref 6.5–8.1)

## 2021-04-06 LAB — CBC
HCT: 38.7 % (ref 36.0–46.0)
Hemoglobin: 12.5 g/dL (ref 12.0–15.0)
MCH: 29.7 pg (ref 26.0–34.0)
MCHC: 32.3 g/dL (ref 30.0–36.0)
MCV: 91.9 fL (ref 80.0–100.0)
Platelets: 364 10*3/uL (ref 150–400)
RBC: 4.21 MIL/uL (ref 3.87–5.11)
RDW: 12.8 % (ref 11.5–15.5)
WBC: 7.4 10*3/uL (ref 4.0–10.5)
nRBC: 0 % (ref 0.0–0.2)

## 2021-04-06 LAB — CREATININE, SERUM
Creatinine, Ser: 0.81 mg/dL (ref 0.44–1.00)
GFR, Estimated: >60 mL/min (ref >60)

## 2021-04-06 LAB — HIV ANTIBODY (ROUTINE TESTING W REFLEX): HIV Screen 4th Generation wRfx: NONREACTIVE

## 2021-04-06 MED ORDER — IOHEXOL 350 MG/ML SOLN
75.0000 mL | Freq: Once | INTRAVENOUS | Status: AC | PRN
  Administered 2021-04-06: 75 mL via INTRAVENOUS

## 2021-04-06 MED ORDER — BUPROPION HCL ER (XL) 150 MG PO TB24
300.0000 mg | ORAL_TABLET | Freq: Every day | ORAL | Status: DC
  Administered 2021-04-07: 300 mg via ORAL
  Filled 2021-04-06: qty 2

## 2021-04-06 MED ORDER — ENOXAPARIN SODIUM 40 MG/0.4ML IJ SOSY
40.0000 mg | PREFILLED_SYRINGE | INTRAMUSCULAR | Status: DC
  Administered 2021-04-06: 40 mg via SUBCUTANEOUS
  Filled 2021-04-06: qty 0.4

## 2021-04-06 MED ORDER — ACETAMINOPHEN 650 MG RE SUPP: 650.0000 mg | RECTAL | Status: DC | PRN

## 2021-04-06 MED ORDER — CLONAZEPAM 0.5 MG PO TABS: 1.0000 mg | ORAL_TABLET | Freq: Two times a day (BID) | ORAL | Status: DC | PRN

## 2021-04-06 MED ORDER — CLOPIDOGREL BISULFATE 75 MG PO TABS
75.0000 mg | ORAL_TABLET | Freq: Once | ORAL | Status: AC
  Administered 2021-04-06: 75 mg via ORAL
  Filled 2021-04-06: qty 1

## 2021-04-06 MED ORDER — ATORVASTATIN CALCIUM 40 MG PO TABS
40.0000 mg | ORAL_TABLET | Freq: Every day | ORAL | Status: DC
  Filled 2021-04-06: qty 1

## 2021-04-06 MED ORDER — GABAPENTIN 300 MG PO CAPS: 600.0000 mg | ORAL_CAPSULE | Freq: Three times a day (TID) | ORAL | Status: DC

## 2021-04-06 MED ORDER — SENNOSIDES-DOCUSATE SODIUM 8.6-50 MG PO TABS: 1.0000 | ORAL_TABLET | Freq: Every evening | ORAL | Status: DC | PRN

## 2021-04-06 MED ORDER — STROKE: EARLY STAGES OF RECOVERY BOOK
Freq: Once | Status: AC
  Filled 2021-04-06: qty 1

## 2021-04-06 MED ORDER — LISDEXAMFETAMINE DIMESYLATE 70 MG PO CAPS: 70.0000 mg | ORAL_CAPSULE | Freq: Every morning | ORAL | Status: DC

## 2021-04-06 MED ORDER — PANTOPRAZOLE SODIUM 40 MG PO TBEC
40.0000 mg | DELAYED_RELEASE_TABLET | Freq: Two times a day (BID) | ORAL | Status: DC
  Administered 2021-04-06 – 2021-04-07 (×2): 40 mg via ORAL
  Filled 2021-04-06 (×2): qty 1

## 2021-04-06 MED ORDER — LAMOTRIGINE 100 MG PO TABS
200.0000 mg | ORAL_TABLET | Freq: Every day | ORAL | Status: DC
  Administered 2021-04-06: 200 mg via ORAL
  Filled 2021-04-06: qty 2

## 2021-04-06 MED ORDER — SERTRALINE HCL 100 MG PO TABS: 100.0000 mg | ORAL_TABLET | Freq: Every day | ORAL | Status: DC

## 2021-04-06 MED ORDER — ACETAMINOPHEN 160 MG/5ML PO SOLN: 650.0000 mg | ORAL | Status: DC | PRN

## 2021-04-06 MED ORDER — VENLAFAXINE HCL ER 37.5 MG PO CP24: 37.5000 mg | ORAL_CAPSULE | Freq: Every day | ORAL | Status: DC

## 2021-04-06 MED ORDER — ASPIRIN 81 MG PO CHEW
81.0000 mg | CHEWABLE_TABLET | Freq: Once | ORAL | Status: AC
  Administered 2021-04-06: 81 mg via ORAL
  Filled 2021-04-06: qty 1

## 2021-04-06 MED ORDER — LAMOTRIGINE 100 MG PO TABS: 100.0000 mg | ORAL_TABLET | Freq: Every day | ORAL | Status: DC

## 2021-04-06 MED ORDER — ACETAMINOPHEN 325 MG PO TABS: 650.0000 mg | ORAL_TABLET | ORAL | Status: DC | PRN

## 2021-04-06 NOTE — Progress Notes (Signed)
Pt admitted to 3W14.  Alert and oriented.  Tele placed on patient and CCMD called; second verification complete.  Call bell within reach and bed alarm set.  Pt verbalizes understanding to call before attempting to get out of bed. ?

## 2021-04-06 NOTE — ED Notes (Signed)
Pt report given to accepting Nurse French Ana and [facility]. All questions and concerns addressed. ?

## 2021-04-06 NOTE — ED Notes (Signed)
Pt ambulatory to restroom at this time. No assistance needed, steady gate noted. Pt denies vision changes, SHOB or dizziness while getting up or walking. ?

## 2021-04-06 NOTE — Progress Notes (Signed)
Overnight progress note ? ?Notified by pharmacy that lisdexamfetamine, sertraline, clonazepam, venlafaxine XR, and gabapentin were ordered at the time of admission but patient confirmed that she is not taking any of these medications at home.  Instead, patient is currently taking bupropion and lamotrigine at home. ?-Discontinue lisdexamfetamine, sertraline, clonazepam, venlafaxine XR, and gabapentin ?-Continue bupropion and lamotrigine ?

## 2021-04-06 NOTE — ED Provider Notes (Signed)
?[facility] EMERGENCY DEPARTMENT ?Provider Note ? ? ?CSN: [id number] ?Arrival date & time: 04/06/21  1112 ? ?  ? ?History ? ?Chief Complaint  ?Patient presents with  ? Visual Field Change  ? ? ?Beth Velez is a 59 y.o. female. ? ?HPI ? ?  ? ?59 year old female with a history of hypertension, prior lumbar laminectomy for radiculopathy left-sided back pain, for which she had been given a Medrol Dosepak in January, who presents with concern for episodes of double vision and being off balance. ? ?Reports this was the fifth episode she has had. She will have episodes where her eyes cross like double vision and she cannot stand, is very off balance, having to hold onto something to keep from falling over. She is under a significant amount of stress at home.  She denies numbness, focal weakness, facial droop, difficulty talking.  During the episodes she has difficulty walking and the visual changes. ? ?She had 1 episode last month, and had prior episodes very occasionally over the last year.  The episode today was more severe so she came for evaluation.  Reports episode today is 5 minutes, with double vision, feeling of severe dizziness and being off balance. ? ?Denies chest pain, shortness of breath, nausea, vomiting, diarrhea, black or bloody stools, fever, headache. ? ? ? ?Past Medical History:  ?Diagnosis Date  ? Diverticulitis   ?  ? ?Home Medications ?Prior to Admission medications   ?Medication Sig Start Date End Date Taking? Authorizing Provider  ?acetaminophen (TYLENOL) 500 MG tablet Take 1,000 mg by mouth every 6 (six) hours as needed.    [provider]  ?amLODipine (NORVASC) 10 MG tablet Take 10 mg by mouth daily. 05/21/19   [provider]  ?amphetamine-dextroamphetamine (ADDERALL XR) 30 MG 24 hr capsule  06/18/14   [provider]  ?atorvastatin (LIPITOR) 40 MG tablet Take 40 mg by mouth at bedtime. 05/06/19   [provider]  ?clonazePAM (KLONOPIN) 1 MG tablet Take  1 mg by mouth 2 (two) times daily as needed for anxiety.    [provider]  ?desvenlafaxine (PRISTIQ) 100 MG 24 hr tablet Take 100 mg by mouth daily. 07/03/19   [provider]  ?Desvenlafaxine Succinate (PRISTIQ PO) Take by mouth.    [provider]  ?dextroamphetamine (DEXTROSTAT) 10 MG tablet Take 1 tablet by mouth 3 (three) times daily. 07/04/19   [provider]  ?ergocalciferol (VITAMIN D2) 1.25 MG (50000 UT) capsule TK 1 C PO WEEKLY FOR 12 WKS 09/11/14   [provider]  ?Estradiol (ESTRACE PO) Take by mouth.    [provider]  ?estradiol (ESTRACE) 0.5 MG tablet Take 0.5 mg by mouth daily. 06/28/19   [provider]  ?fluticasone (FLONASE) 50 MCG/ACT nasal spray Place 1 spray into both nostrils daily. 05/22/19   [provider]  ?gabapentin (NEURONTIN) 300 MG capsule Take 2 capsules by mouth 3 (three) times daily. 09/17/17   [provider]  ?lamoTRIgine (LAMICTAL) 100 MG tablet Take 100 mg by mouth daily.    [provider]  ?lamoTRIgine (LAMICTAL) 200 MG tablet Take 300 mg by mouth at bedtime. 06/24/19   [provider]  ?pantoprazole (PROTONIX) 40 MG tablet Take 40 mg by mouth 2 (two) times daily. 07/04/19   [provider]  ?REXULTI 2 MG TABS tablet Take 2 mg by mouth at bedtime. 04/23/19   [provider]  ?sertraline (ZOLOFT) 100 MG tablet TAKE 1 TABLET BY MOUTH ONCE  DAILY FOR 7 DAYS AND THEN INCREASE TO 2 TABLETS ONCE DAILY 06/24/19   [provider]  ?VYVANSE 70 MG capsule Take 70 mg by mouth every morning. 06/28/19   [provider]  ?WELLBUTRIN XL 300 MG 24 hr tablet Take 300 mg by mouth every morning. 05/03/19   [provider]  ?   ? ?Allergies    ?Patient has no known allergies.   ? ?Review of Systems   ?Review of Systems ?See above ? ?Physical Exam ?Updated Vital Signs ?BP (!) 156/108   Pulse 97   Temp 99.3 ?F (37.4 ?C) (Oral)   Resp 15   Ht 5\' 4"  (1.626 m)   Wt  77.1 kg   SpO2 100%   BMI 29.18 kg/m?  ?Physical Exam ?Vitals and nursing note reviewed.  ?Constitutional:   ?   General: She is not in acute distress. ?   Appearance: Normal appearance. She is well-developed. She is not ill-appearing or diaphoretic.  ?HENT:  ?   Head: Normocephalic and atraumatic.  ?Eyes:  ?   General: No visual field deficit. ?   Extraocular Movements: Extraocular movements intact.  ?   Conjunctiva/sclera: Conjunctivae normal.  ?   Pupils: Pupils are equal, round, and reactive to light.  ?Cardiovascular:  ?   Rate and Rhythm: Normal rate and regular rhythm.  ?   Pulses: Normal pulses.  ?   Heart sounds: Normal heart sounds. No murmur heard. ?  No friction rub. No gallop.  ?Pulmonary:  ?   Effort: Pulmonary effort is normal. No respiratory distress.  ?   Breath sounds: Normal breath sounds. No wheezing or rales.  ?Abdominal:  ?   General: There is no distension.  ?   Palpations: Abdomen is soft.  ?   Tenderness: There is no abdominal tenderness. There is no guarding.  ?Musculoskeletal:     ?   General: No swelling or tenderness.  ?   Cervical back: Normal range of motion.  ?Skin: ?   General: Skin is warm and dry.  ?   Findings: No erythema or rash.  ?Neurological:  ?   General: No focal deficit present.  ?   Mental Status: She is alert and oriented to person, place, and time.  ?   GCS: GCS eye subscore is 4. GCS verbal subscore is 5. GCS motor subscore is 6.  ?   Cranial Nerves: No cranial nerve deficit, dysarthria or facial asymmetry.  ?   Sensory: No sensory deficit.  ?   Motor: No weakness or tremor.  ?   Coordination: Coordination normal. Finger-Nose-Finger Test normal.  ?   Gait: Gait normal.  ? ? ?ED Results / Procedures / Treatments   ?Labs ?(all labs ordered are listed, but only abnormal results are displayed) ?Labs Reviewed  ?CBC WITH DIFFERENTIAL/PLATELET - Abnormal; Notable for the following components:  ?    Result Value  ? Platelets 402 (*)   ? All other components within normal  limits  ?COMPREHENSIVE METABOLIC PANEL  ? ? ?EKG ?EKG Interpretation ? ?Date/Time:  Wednesday April 06 2021 13:20:03 EDT ?Ventricular Rate:  82 ?PR Interval:  161 ?QRS Duration: 86 ?QT Interval:  414 ?QTC Calculation: 484 ?R Axis:   48 ?Text Interpretation: Sinus rhythm No significant change since last tracing Confirmed by Alvira MondaySchlossman, Samaya Boardley ([id number]) on 04/06/2021 1:28:35 PM ? ?Radiology ?CT ANGIO HEAD NECK W WO CM ? ?Result Date: 04/06/2021 ?CLINICAL DATA:  Neuro deficit, persistent/recurrent, CNS neoplasm suspected EXAM: CT ANGIOGRAPHY  HEAD AND NECK TECHNIQUE: Multidetector CT imaging of the head and neck was performed using the standard protocol during bolus administration of intravenous contrast. Multiplanar CT image reconstructions and MIPs were obtained to evaluate the vascular anatomy. Carotid stenosis measurements (when applicable) are obtained utilizing NASCET criteria, using the distal internal carotid diameter as the denominator. RADIATION DOSE REDUCTION: This exam was performed according to the departmental dose-optimization program which includes automated exposure control, adjustment of the mA and/or kV according to patient size and/or use of iterative reconstruction technique. CONTRAST:  27mL OMNIPAQUE IOHEXOL 350 MG/ML SOLN COMPARISON:  None. FINDINGS: CT HEAD FINDINGS Brain: No evidence of acute large vascular territory infarction, hemorrhage, hydrocephalus, extra-axial collection or mass lesion/mass effect. Partially empty sella. Vascular: Detailed below. Skull: No acute fracture. Sinuses: Visualized sinuses are clear. Orbits: No acute finding. Review of the MIP images confirms the above findings CTA NECK FINDINGS Aortic arch: Great vessel origins are patent without significant stenosis. Right carotid system: No evidence of dissection, stenosis (50% or greater) or occlusion. Left carotid system: No clear evidence of dissection, stenosis (50% or greater) or occlusion. Mild linear appearing filling defect  involving the left ICA near the skull base may be artifactual and is atypical for dissection. Vertebral arteries: Left dominant. No evidence of dissection, stenosis (50% or greater) or occlusion. Artifact lim

## 2021-04-06 NOTE — ED Notes (Signed)
Attempted to call report to accepting Nurse. RN to call back.  ?

## 2021-04-06 NOTE — ED Notes (Signed)
Pt to CT

## 2021-04-06 NOTE — ED Triage Notes (Signed)
Reports sudden onset of "cross eyed" vision".  Says this has happened five different times but never got it checked out before.  Episode lasted 4-5 minutes.  Currently resolved.   ?

## 2021-04-06 NOTE — Progress Notes (Signed)
Originating Facility: [facility] ?Requesting Physician/APP: Alvira Monday, MD ? ?History: ?Beth Velez is a 59 year old female with past medical history significant for HTN, HLD, GERD, vertigo, depression/anxiety, prior lumbar laminectomy who presented to med [facility] with concerns for double vision, vertigo and feeling "off balance".  Per EDP, patient also has been under significant stress recently.  Work-up notable for CT head with no acute findings but with partially empty sella and CT angiogram head with age-indeterminate occlusion of small/nondominant right vertebral artery.  ED physician discussed case with neurology, Dr. Selina Cooley; who recommended admission for TIA work-up. ? ?Plan of Care:  ?Excepted to medical telemetry bed for TIA work-up ?Contact neurology on patient's arrival ? ?[facility] will assume care on arrival to accepting facility. Until arrival, care as per EDP. However, [facility] available 24/7 for questions and assistance. ? ?  ?Please page [facility] Admits and Consults [phone]) as soon as the patient arrives to the hospital.  ? ?Minerva Areola [place], DO ? ?

## 2021-04-06 NOTE — Evaluation (Signed)
Occupational Therapy Evaluation ?Patient Details ?Name: Beth Velez ?MRN: [id number] ?DOB: 05/17/62 ?Today's Date: 04/06/2021 ? ? ?History of Present Illness 59 y.o. female with medical history significant of hypertension, prior lumbar laminectomy for radiculopathy left-sided back pain, hyperlipidemia, hypertension, GERD, depression, anxiety and vertigo presented to [facility] with a complaint of double vision as well as being off balance.  ? ?Clinical Impression ?  ?Patient admitted with transient onset of double vision and unsteadiness.  Patient states all symptoms resolved, and she is essentially at her baseline.  PTA she lives at home with her two grown daughters, and her 31 yo mother.  The patient is currently on disability, and does have chronic back issues.  She is still being worked up for a stroke, and given 5 previous attacks of the same symptoms, OT placed high level goals to follow.  No anticipated OT needs post acute.    ?   ? ?Recommendations for follow up therapy are one component of a multi-disciplinary discharge planning process, led by the attending physician.  Recommendations may be updated based on patient status, additional functional criteria and insurance authorization.  ? ?Follow Up Recommendations ? No OT follow up  ?  ?Assistance Recommended at Discharge None  ?Patient can return home with the following   ? ?  ?Functional Status Assessment ? Patient has not had a recent decline in their functional status  ?Equipment Recommendations ? None recommended by OT  ?  ?Recommendations for Other Services Rehab consult ? ? ?  ?Precautions / Restrictions Precautions ?Precautions: Fall ?Restrictions ?Weight Bearing Restrictions: No  ? ?  ? ?Mobility Bed Mobility ?Overal bed mobility: Independent ?  ?  ?  ?  ?  ?  ?  ?  ? ?Transfers ?Overall transfer level: Independent ?  ?  ?  ?  ?  ?  ?  ?  ?  ?  ? ?  ?Balance Overall balance assessment: No apparent balance deficits (not formally assessed) ?  ?  ?  ?  ?   ?  ?  ?  ?  ?  ?  ?  ?  ?  ?  ?  ?  ?  ?   ? ?ADL either performed or assessed with clinical judgement  ? ?ADL Overall ADL's : At baseline ?  ?  ?  ?  ?  ?  ?  ?  ?  ?  ?  ?  ?  ?  ?  ?  ?  ?  ?  ?   ? ? ? ?Vision Patient Visual Report: No change from baseline ?   ?   ?Perception Perception ?Perception: Within Functional Limits ?  ?Praxis Praxis ?Praxis: Intact ?  ? ?Pertinent Vitals/Pain Pain Assessment ?Pain Assessment: No/denies pain  ? ? ? ?Hand Dominance Right ?  ?Extremity/Trunk Assessment Upper Extremity Assessment ?Upper Extremity Assessment: Overall WFL for tasks assessed ?  ?Lower Extremity Assessment ?Lower Extremity Assessment: Defer to PT evaluation ?  ?Cervical / Trunk Assessment ?Cervical / Trunk Assessment: Normal ?  ?Communication Communication ?Communication: No difficulties ?  ?Cognition Arousal/Alertness: Awake/alert ?Behavior During Therapy: [facility] for tasks assessed/performed ?Overall Cognitive Status: Within Functional Limits for tasks assessed ?  ?  ?  ?  ?  ?  ?  ?  ?  ?  ?  ?  ?  ?  ?  ?  ?General Comments: emotional when talking about her kids ?  ?  ?General Comments   VSS  on RA ? ?  ?Exercises   ?  ?Shoulder Instructions    ? ? ?Home Living Family/patient expects to be discharged to:: Private residence ?Living Arrangements: Children;Parent ?Available Help at Discharge: Family;Available 24 hours/day ?Type of Home: House ?Home Access: Stairs to enter ?  ?  ?Home Layout: Two level;Bed/bath upstairs;Full bath on main level ?Alternate Level Stairs-Number of Steps: 89 yo mother lives on the first floor, full flight to upstairs ?  ?Bathroom Shower/Tub: Tub/shower unit;Walk-in shower ?  ?Bathroom Toilet: Standard ?  ?  ?Home Equipment: None ?  ?  ?  ? ?  ?Prior Functioning/Environment Prior Level of Function : Independent/Modified Independent;Driving ?  ?  ?  ?  ?  ?  ?  ?ADLs Comments: No assist with ADL/IADL ?  ? ?  ?  ?OT Problem List: Other (comment) ?  ?   ?OT Treatment/Interventions:  Self-care/ADL training;Therapeutic activities;Visual/perceptual remediation/compensation  ?  ?OT Goals(Current goals can be found in the care plan section) Acute Rehab OT Goals ?Patient Stated Goal: return home ?OT Goal Formulation: With patient ?Potential to Achieve Goals: Good  ?OT Frequency: Min 2X/week ?  ? ?Co-evaluation   ?  ?  ?  ?  ? ?  ?AM-PAC OT "6 Clicks" Daily Activity     ?Outcome Measure Help from another person eating meals?: None ?Help from another person taking care of personal grooming?: None ?Help from another person toileting, which includes using toliet, bedpan, or urinal?: None ?Help from another person bathing (including washing, rinsing, drying)?: None ?Help from another person to put on and taking off regular upper body clothing?: None ?Help from another person to put on and taking off regular lower body clothing?: None ?6 Click Score: 24 ?  ?End of Session Nurse Communication: Mobility status ? ?Activity Tolerance: Patient tolerated treatment well ?Patient left: in bed;with call bell/phone within reach ? ?OT Visit Diagnosis: Dizziness and giddiness (R42)  ?              ?Time: 7425-9563 ?OT Time Calculation (min): 22 min ?Charges:  OT General Charges ?$OT Visit: 1 Visit ?OT Evaluation ?$OT Eval Moderate Complexity: 1 Mod ? ?04/06/2021 ? ?RP, OTR/L ? ?Acute Rehabilitation Services ? ?Office:  [phone] ? ? ?Nhat Hearne D Gildardo Tickner ?04/06/2021, 5:41 PM ?

## 2021-04-06 NOTE — H&P (Signed)
?History and Physical  ? ? ?Beth Velez WUJ:[id number] DOB: 05-Jun-1962 DOA: 04/06/2021 ? ?PCP: Judd LienWebb, Meghan H, PA-C  ?Patient coming from: Home ? ?I have personally briefly reviewed [facility] ? ?Chief Complaint: Double vision/off-balance ? ?HPI: Beth Velez is a 59 y.o. female with medical history significant of hypertension, prior lumbar laminectomy for radiculopathy left-sided back pain, hyperlipidemia, hypertension, GERD, depression, anxiety and vertigo presented [facility] with a complaint of double vision as well as being off balance.  Per patient, she was sitting on the toilet at her home when this happened.  The off-balance complaint lasted only 2 minutes but the double vision lasted longer.  This was her fifth episode.  But this time the double vision lasted longer so she decided to go to the ED.  She did not have any other complaint.  By the time she arrived to the ED, her symptoms had resolved.  Patient states that she is also going through a lot of anxiety since July last year because her husband abandoned her and her 2 artistic daughters.  She is taking care of both the kids by herself.  She went started crying when talking about that.  Currently she has no symptoms.  She has fluent speech and she already passed swallow evaluation.  When she arrived to the ED, she was hemodynamically stable.  Vitals were stable as well.  Labs are stable as well.  She underwent CT head and CT angiogram of the head and neck, CT head was negative for any acute stroke ? ?Review of Systems: As per HPI otherwise negative.  ? ? ?Past Medical History:  ?Diagnosis Date  ? Diverticulitis   ? ? ?Past Surgical History:  ?Procedure Laterality Date  ? ABDOMINAL HYSTERECTOMY    ? APPENDECTOMY    ? BACK SURGERY    ? ? ? reports that she has never smoked. She has never used smokeless tobacco. She reports that she does not drink alcohol and does not use drugs. ? ?No Known Allergies ? ?No family  history on file. ? ?Prior to Admission medications   ?Medication Sig Start Date End Date Taking? Authorizing Provider  ?acetaminophen (TYLENOL) 500 MG tablet Take 1,000 mg by mouth every 6 (six) hours as needed.    [provider]  ?amLODipine (NORVASC) 10 MG tablet Take 10 mg by mouth daily. 05/21/19   [provider]  ?amphetamine-dextroamphetamine (ADDERALL XR) 30 MG 24 hr capsule  06/18/14   [provider]  ?atorvastatin (LIPITOR) 40 MG tablet Take 40 mg by mouth at bedtime. 05/06/19   [provider]  ?clonazePAM (KLONOPIN) 1 MG tablet Take 1 mg by mouth 2 (two) times daily as needed for anxiety.    [provider]  ?desvenlafaxine (PRISTIQ) 100 MG 24 hr tablet Take 100 mg by mouth daily. 07/03/19   [provider]  ?Desvenlafaxine Succinate (PRISTIQ PO) Take by mouth.    [provider]  ?dextroamphetamine (DEXTROSTAT) 10 MG tablet Take 1 tablet by mouth 3 (three) times daily. 07/04/19   [provider]  ?ergocalciferol (VITAMIN D2) 1.25 MG (50000 UT) capsule Take 50,000 Units by mouth once a week. 09/11/14   [provider]  ?Estradiol (ESTRACE PO) Take by mouth.    [provider]  ?estradiol (ESTRACE) 0.5 MG tablet Take 0.5 mg by mouth daily. 06/28/19   [provider]  ?fluticasone (FLONASE) 50 MCG/ACT nasal spray Place 1 spray into both nostrils  daily. 05/22/19   [provider]  ?gabapentin (NEURONTIN) 300 MG capsule Take 2 capsules by mouth 3 (three) times daily. 09/17/17   [provider]  ?lamoTRIgine (LAMICTAL) 100 MG tablet Take 100 mg by mouth daily.    [provider]  ?lamoTRIgine (LAMICTAL) 200 MG tablet Take 300 mg by mouth at bedtime. 06/24/19   [provider]  ?pantoprazole (PROTONIX) 40 MG tablet Take 40 mg by mouth 2 (two) times daily. 07/04/19   [provider]  ?REXULTI 2 MG TABS tablet Take 2 mg by mouth at bedtime. 04/23/19   [provider]   ?sertraline (ZOLOFT) 100 MG tablet Take 100 mg by mouth daily. 06/24/19   [provider]  ?VYVANSE 70 MG capsule Take 70 mg by mouth daily. 06/28/19   [provider]  ?WELLBUTRIN XL 300 MG 24 hr tablet Take 300 mg by mouth daily. 05/03/19   [provider]  ? ? ?Physical Exam: ?Vitals:  ? 04/06/21 1135 04/06/21 1315 04/06/21 1330 04/06/21 1636  ?BP:  (!) 154/93 (!) 156/108 (!) 158/102  ?Pulse:  73 97 89  ?Resp:  14 15 16   ?Temp:    98.4 ?F (36.9 ?C)  ?TempSrc:    Oral  ?SpO2:  100% 100% 97%  ?Weight: 77.1 kg     ?Height: 5\' 4"  (1.626 m)     ? ? ?Constitutional: NAD, calm, comfortable ?Vitals:  ? 04/06/21 1135 04/06/21 1315 04/06/21 1330 04/06/21 1636  ?BP:  (!) 154/93 (!) 156/108 (!) 158/102  ?Pulse:  73 97 89  ?Resp:  14 15 16   ?Temp:    98.4 ?F (36.9 ?C)  ?TempSrc:    Oral  ?SpO2:  100% 100% 97%  ?Weight: 77.1 kg     ?Height: 5\' 4"  (1.626 m)     ? ?Eyes: PERRL, lids and conjunctivae normal ?ENMT: Mucous membranes are moist. Posterior pharynx clear of any exudate or lesions.Normal dentition.  ?Neck: normal, supple, no masses, no thyromegaly ?Respiratory: clear to auscultation bilaterally, no wheezing, no crackles. Normal respiratory effort. No accessory muscle use.  ?Cardiovascular: Regular rate and rhythm, no murmurs / rubs / gallops. No extremity edema. 2+ pedal pulses. No carotid bruits.  ?Abdomen: no tenderness, no masses palpated. No hepatosplenomegaly. Bowel sounds positive.  ?Musculoskeletal: no clubbing / cyanosis. No joint deformity upper and lower extremities. Good ROM, no contractures. Normal muscle tone.  ?Skin: no rashes, lesions, ulcers. No induration ?Neurologic: CN 2-12 grossly intact. Sensation intact, DTR normal. Strength 5/5 in all 4.  ?Psychiatric: Normal judgment and insight. Alert and oriented x 3. Normal mood.  ? ? ?Labs on Admission: I have personally reviewed following labs and imaging studies ? ?CBC: ?Recent Labs  ?Lab 04/06/21 ?1141  ?WBC 6.4  ?NEUTROABS 3.3  ?HGB  13.4  ?HCT 40.6  ?MCV 90.8  ?PLT 402*  ? ?Basic Metabolic Panel: ?Recent Labs  ?Lab 04/06/21 ?1141  ?NA 137  ?K 3.6  ?CL 104  ?CO2 23  ?GLUCOSE 97  ?BUN 9  ?CREATININE 0.82  ?CALCIUM 9.6  ? ?GFR: ?Estimated Creatinine Clearance: 74.3 mL/min (by C-G formula based on SCr of 0.82 mg/dL). ?Liver Function Tests: ?Recent Labs  ?Lab 04/06/21 ?1141  ?AST 18  ?ALT 17  ?ALKPHOS 52  ?BILITOT 0.5  ?PROT 7.7  ?ALBUMIN 4.4  ? ?No results for input(s): LIPASE, AMYLASE in the last 168 hours. ?No results for input(s): AMMONIA in the last 168 hours. ?Coagulation Profile: ?No results for input(s): INR, PROTIME in the last  168 hours. ?Cardiac Enzymes: ?No results for input(s): CKTOTAL, CKMB, CKMBINDEX, TROPONINI in the last 168 hours. ?BNP (last 3 results) ?No results for input(s): PROBNP in the last 8760 hours. ?HbA1C: ?No results for input(s): HGBA1C in the last 72 hours. ?CBG: ?No results for input(s): GLUCAP in the last 168 hours. ?Lipid Profile: ?No results for input(s): CHOL, HDL, LDLCALC, TRIG, CHOLHDL, LDLDIRECT in the last 72 hours. ?Thyroid Function Tests: ?No results for input(s): TSH, T4TOTAL, FREET4, T3FREE, THYROIDAB in the last 72 hours. ?Anemia Panel: ?No results for input(s): VITAMINB12, FOLATE, FERRITIN, TIBC, IRON, RETICCTPCT in the last 72 hours. ?Urine analysis: ?   ?Component Value Date/Time  ? COLORURINE YELLOW 10/26/2006 2116  ? APPEARANCEUR CLEAR 10/26/2006 2116  ? LABSPEC 1.007 10/26/2006 2116  ? PHURINE 6.5 10/26/2006 2116  ? GLUCOSEU NEG mg/dL 74/12/8784 7672  ? HGBUR trace-lysed 10/26/2006 0915  ? BILIRUBINUR NEG 10/26/2006 2116  ? KETONESUR NEG mg/dL 09/47/0962 8366  ? PROTEINUR NEG mg/dL 29/47/6546 5035  ? UROBILINOGEN 0.2 10/26/2006 2116  ? NITRITE NEG 10/26/2006 2116  ? LEUKOCYTESUR NEG 10/26/2006 2116  ? ? ?Radiological Exams on Admission: ?CT ANGIO HEAD NECK W WO CM ? ?Result Date: 04/06/2021 ?CLINICAL DATA:  Neuro deficit, persistent/recurrent, CNS neoplasm suspected EXAM: CT ANGIOGRAPHY HEAD AND NECK  TECHNIQUE: Multidetector CT imaging of the head and neck was performed using the standard protocol during bolus administration of intravenous contrast. Multiplanar CT image reconstructions and MIPs were obtai

## 2021-04-06 NOTE — Consult Note (Addendum)
Neurology Consultation ? ?Reason for Consult: TIA workup ?Referring Physician: Dr. Jacqulyn BathPahwani  ? ?CC: double vision and being off balance ? ?History is obtained from:patient  ? ?HPI: Beth Velez is a 59 y.o. female with past medical history of HTN, HLD, GERD, anxiety and depression, vertigo and diverticulitis who presents to [facility] ED for cc of double vision and being off balance. She states this happened while urinating on the toilet. This episode lasted for about 5 minutes. She states that these symptoms have occurred about 5 times in the past year. Today however symptoms lasted longer than usual which prompted her to go to the ED to be evaluated. On arrival to ED her symptoms had resolved. She also tells me that she has been under a lot of stress the last 8 months or so. She becomes tearful and tells me that her husband has abandoned her and their 2 autistic daughters, she has no job or insurance. CTH revealed no acute abnormality. CTA head revealed Age-indeterminate occlusion of the small/non dominant right vertebral artery. ? ? ?LKW: 1000 am  ?tpa given?: no, symptoms resolved  ?Premorbid modified Rankin scale (mRS):  ?0-Completely asymptomatic and back to baseline post-stroke ? ?ROS: Full ROS was performed and is negative except as noted in the HPI ? ?Past Medical History:  ?Diagnosis Date  ? Diverticulitis   ? ? ? ?Essential (primary) hypertension  ? ?No family history on file. ? ? ?Social History:  ? reports that she has never smoked. She has never used smokeless tobacco. She reports that she does not drink alcohol and does not use drugs. ? ?Medications ? ?Current Facility-Administered Medications:  ?   stroke: mapping our early stages of recovery book, , Does not apply, Once, Hughie ClossPahwani, Ravi, MD ?  acetaminophen (TYLENOL) tablet 650 mg, 650 mg, Oral, Q4H PRN **OR** acetaminophen (TYLENOL) 160 MG/5ML solution 650 mg, 650 mg, Per Tube, Q4H PRN **OR** acetaminophen (TYLENOL) suppository 650 mg, 650 mg, Rectal, Q4H  PRN, Hughie ClossPahwani, Ravi, MD ?  atorvastatin (LIPITOR) tablet 40 mg, 40 mg, Oral, QHS, Pahwani, Ravi, MD ?  buPROPion (WELLBUTRIN XL) 24 hr tablet 300 mg, 300 mg, Oral, Daily, Pahwani, Ravi, MD ?  clonazePAM (KLONOPIN) tablet 1 mg, 1 mg, Oral, BID PRN, Hughie ClossPahwani, Ravi, MD ?  enoxaparin (LOVENOX) injection 40 mg, 40 mg, Subcutaneous, Q24H, Pahwani, Ravi, MD ?  gabapentin (NEURONTIN) capsule 600 mg, 600 mg, Oral, TID, Pahwani, Ravi, MD ?  lamoTRIgine (LAMICTAL) tablet 100 mg, 100 mg, Oral, Daily, Pahwani, Ravi, MD ?  lamoTRIgine (LAMICTAL) tablet 300 mg, 300 mg, Oral, QHS, Pahwani, Daleen Boavi, MD ?  Melene Muller[START ON 04/07/2021] lisdexamfetamine (VYVANSE) capsule 70 mg, 70 mg, Oral, q morning, Pahwani, Ravi, MD ?  pantoprazole (PROTONIX) EC tablet 40 mg, 40 mg, Oral, BID, Pahwani, Ravi, MD ?  senna-docusate (Senokot-S) tablet 1 tablet, 1 tablet, Oral, QHS PRN, Hughie ClossPahwani, Ravi, MD ?  sertraline (ZOLOFT) tablet 100 mg, 100 mg, Oral, Daily, Pahwani, Ravi, MD ?  Melene Muller[START ON 04/07/2021] venlafaxine XR (EFFEXOR-XR) 24 hr capsule 37.5 mg, 37.5 mg, Oral, Q breakfast, Hughie ClossPahwani, Ravi, MD ? ? ?Exam: ?Current vital signs: ?BP (!) 158/102 (BP Location: Left Arm)   Pulse 89   Temp 98.4 ?F (36.9 ?C) (Oral)   Resp 16   Ht 5\' 4"  (1.626 m)   Wt 77.1 kg   SpO2 97%   BMI 29.18 kg/m?  ?Vital signs in last 24 hours: ?Temp:  [98.4 ?F (36.9 ?C)-99.3 ?F (37.4 ?C)] 98.4 ?F (36.9 ?C) (03/15 1636) ?  Pulse Rate:  [73-111] 89 (03/15 1636) ?Resp:  [14-18] 16 (03/15 1636) ?BP: (148-158)/(92-108) 158/102 (03/15 1636) ?SpO2:  [97 %-100 %] 97 % (03/15 1636) ?Weight:  [77.1 kg] 77.1 kg (03/15 1135) ? ?GENERAL: Awake, alert in NAD ?HEENT: - Normocephalic and atraumatic, dry mm ?LUNGS - Clear to auscultation bilaterally with no wheezes ?CV - S1S2 RRR, no m/r/g, equal pulses bilaterally. ?ABDOMEN - Soft, nontender, nondistended with normoactive BS ?Ext: warm, well perfused, intact peripheral pulses, no edema ? ?NEURO:  ?Mental Status: AA&Ox4 ?Language: speech is clear  Naming,  repetition, fluency, and comprehension intact. ?Cranial Nerves: PERRL 3 mm/brisk. EOMI, visual fields full, no facial asymmetry, facial sensation intact, hearing intact, tongue/uvula/soft palate midline, normal sternocleidomastoid and trapezius muscle strength. No evidence of tongue atrophy or fibrillations ?Motor: 5/5 in all 4 extremities ?Tone: is normal and bulk is normal ?Sensation- Intact to light touch bilaterally ?Coordination: FTN intact bilaterally, no ataxia in BLE. ?Gait- deferred ? ?NIHSS ?1a Level of Conscious.: 0 ?1b LOC Questions: 0 ?1c LOC Commands: 0 ?2 Best Gaze: 0 ?3 Visual: 0 ?4 Facial Palsy: 0 ?5a Motor Arm - left: 0 ?5b Motor Arm - Right: 0 ?6a Motor Leg - Left: 0 ?6b Motor Leg - Right: 0 ?7 Limb Ataxia: 0 ?8 Sensory: 0 ?9 Best Language: 0 ?10 Dysarthria: 0 ?11 Extinct. and Inatten.: 0 ?TOTAL: 0  ? ?Labs ?I have reviewed labs in epic and the results pertinent to this consultation are: ?0 ? ?CBC ?   ?Component Value Date/Time  ? WBC 6.4 04/06/2021 1141  ? RBC 4.47 04/06/2021 1141  ? HGB 13.4 04/06/2021 1141  ? HCT 40.6 04/06/2021 1141  ? PLT 402 (H) 04/06/2021 1141  ? MCV 90.8 04/06/2021 1141  ? MCH 30.0 04/06/2021 1141  ? MCHC 33.0 04/06/2021 1141  ? RDW 12.8 04/06/2021 1141  ? LYMPHSABS 2.5 04/06/2021 1141  ? MONOABS 0.4 04/06/2021 1141  ? EOSABS 0.1 04/06/2021 1141  ? BASOSABS 0.1 04/06/2021 1141  ? ? ?CMP  ?   ?Component Value Date/Time  ? NA 137 04/06/2021 1141  ? K 3.6 04/06/2021 1141  ? CL 104 04/06/2021 1141  ? CO2 23 04/06/2021 1141  ? GLUCOSE 97 04/06/2021 1141  ? GLUCOSE 119 (H) 11/17/2005 1448  ? BUN 9 04/06/2021 1141  ? CREATININE 0.82 04/06/2021 1141  ? CALCIUM 9.6 04/06/2021 1141  ? PROT 7.7 04/06/2021 1141  ? ALBUMIN 4.4 04/06/2021 1141  ? AST 18 04/06/2021 1141  ? ALT 17 04/06/2021 1141  ? ALKPHOS 52 04/06/2021 1141  ? BILITOT 0.5 04/06/2021 1141  ? GFRNONAA >60 04/06/2021 1141  ? GFRAA >60 07/14/2019 1033  ? ? ? ?Imaging ?I have reviewed the images obtained: ? ?CT-head  3/15: ?1. No evidence of acute intracranial abnormality. ?2. Partially empty sella, which is often a normal anatomic variant but can be associated with idiopathic intracranial hypertension. ? ?CTA head 3/15: ?1. 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 ?2. Otherwise, no proximal hemodynamically significant stenosis ? ?Assessment:  ?IYANNA DRUMMER is a 59 y.o. female with past medical history of HTN, HLD, GERD, anxiety and depression, vertigo and diverticulitis who presents to [facility] ED for cc of double vision and being off balance. She states this happened while urinating on the toilet. This episode lasted for about 5 minutes. She states that these symptoms have occurred about 5 times in the past few months. ? ?Impression: ?Seizure vs non epileptic event. ? ?  Recommendations: ?- MRI of the brain without contrast ?- Frequent neuro checks ?- routine EEG ? ?Gevena Mart DNP, ACNPC- AG  ? ? ?NEUROHOSPITALIST ADDENDUM ?Performed a face to face diagnostic evaluation.  ? ?I have reviewed the contents of history and physical exam as documented by PA/ARNP/Resident and agree with above documentation.  ?I have discussed and formulated the above plan as documented. Edits to the note have been made as needed. ? ?Impression/Key exam findings/Plan: she reports brief 2-3 mins episodes of double vision + vertigo. Has had the exact same event over 8 times in the last few months. I would not expect TIA to be so short lived or to be the exact same episode every time. She reports tremendous amount of emotional and financial stress from undergoing separation from her husband about 8 months ago. She has been eating fine, been drinking plenty of water. She sleeps fine but wakes up several times in the middle of night. She reports that daughter has seizures. ? ?I reviewed her MRI brain which was negative for an acute stroke or any structural  abnormality that puts her at risk for seizures. She does not drink alcohol, does not smoke. ? ?Will get a routine EEG but if negative, would recommend outpatient neuro follow up. ? ?Erick Blinks, MD ?[facility]

## 2021-04-07 ENCOUNTER — Observation Stay (HOSPITAL_COMMUNITY): Payer: Medicare HMO

## 2021-04-07 ENCOUNTER — Other Ambulatory Visit (HOSPITAL_COMMUNITY): Payer: Self-pay

## 2021-04-07 ENCOUNTER — Observation Stay (HOSPITAL_BASED_OUTPATIENT_CLINIC_OR_DEPARTMENT_OTHER): Payer: Medicare HMO

## 2021-04-07 DIAGNOSIS — H532 Diplopia: Secondary | ICD-10-CM

## 2021-04-07 DIAGNOSIS — R55 Syncope and collapse: Secondary | ICD-10-CM

## 2021-04-07 DIAGNOSIS — E785 Hyperlipidemia, unspecified: Secondary | ICD-10-CM | POA: Diagnosis not present

## 2021-04-07 DIAGNOSIS — R2681 Unsteadiness on feet: Secondary | ICD-10-CM | POA: Diagnosis not present

## 2021-04-07 DIAGNOSIS — I10 Essential (primary) hypertension: Secondary | ICD-10-CM | POA: Diagnosis not present

## 2021-04-07 DIAGNOSIS — R404 Transient alteration of awareness: Secondary | ICD-10-CM

## 2021-04-07 DIAGNOSIS — F439 Reaction to severe stress, unspecified: Secondary | ICD-10-CM

## 2021-04-07 LAB — LIPID PANEL
Cholesterol: 371 mg/dL — ABNORMAL HIGH (ref 0–200)
HDL: 42 mg/dL (ref >40)
LDL Cholesterol: UNDETERMINED mg/dL (ref 0–99)
Total CHOL/HDL Ratio: 8.8 RATIO
Triglycerides: 419 mg/dL — ABNORMAL HIGH (ref <150)
VLDL: UNDETERMINED mg/dL (ref 0–40)

## 2021-04-07 LAB — ECHOCARDIOGRAM COMPLETE
AR max vel: 1.97 cm2
AV Area VTI: 1.74 cm2
AV Area mean vel: 1.84 cm2
AV Mean grad: 4 mmHg
AV Peak grad: 7 mmHg
Ao pk vel: 1.32 m/s
Area-P 1/2: 2.66 cm2
Height: 64 in
S' Lateral: 3.6 cm
Weight: 2720 oz

## 2021-04-07 LAB — LDL CHOLESTEROL, DIRECT: Direct LDL: 264.9 mg/dL — ABNORMAL HIGH (ref 0–99)

## 2021-04-07 MED ORDER — OXYCODONE-ACETAMINOPHEN 5-325 MG PO TABS
1.0000 | ORAL_TABLET | Freq: Four times a day (QID) | ORAL | Status: DC | PRN
  Administered 2021-04-07: 1 via ORAL
  Filled 2021-04-07: qty 1

## 2021-04-07 MED ORDER — ATORVASTATIN CALCIUM 40 MG PO TABS
40.0000 mg | ORAL_TABLET | Freq: Every day | ORAL | 0 refills | Status: DC
  Filled 2021-04-07: qty 30, 30d supply, fill #0

## 2021-04-07 MED ORDER — AMLODIPINE BESYLATE 10 MG PO TABS
10.0000 mg | ORAL_TABLET | Freq: Every day | ORAL | Status: DC
  Administered 2021-04-07: 10 mg via ORAL
  Filled 2021-04-07: qty 1

## 2021-04-07 NOTE — Progress Notes (Signed)
?  Transition of Care (TOC) Screening Note ? ? ?Patient Details  ?Name: Beth Velez ?Date of Birth: November 17, 1962 ? ? ?Transition of Care [facility]) CM/SW Contact:    ?Geralynn Ochs, LCSW ?Phone Number: ?04/07/2021, 10:19 AM ? ? ? ?Transition of Care Department [facility]) has reviewed patient and no TOC needs have been identified at this time. We will continue to monitor patient advancement through interdisciplinary progression rounds. If new patient transition needs arise, please place a TOC consult. ?  ?

## 2021-04-07 NOTE — TOC Transition Note (Addendum)
Transition of Care (TOC) - CM/SW Discharge Note ? ? ?Patient Details  ?Name: Beth Velez ?MRN: [id number] ?Date of Birth: 10/21/62 ? ?Transition of Care (TOC) CM/SW Contact:  ?Pollie Friar, RN ?Phone Number: ?04/07/2021, 11:32 AM ? ? ?Clinical Narrative:   ?PCP: Dr Justin Mend  ?Patient is discharging home with self care. No f/u per PT/OT.  ?CM consulted for no insurance. CM met with the patient and she has [employer]. This has been verified by [facility] pharmacy.  ?Pt is going to [facility] home.  ?She has supervision at home.  ? ? ?Final next level of care: Home/Self Care ?Barriers to Discharge: No Barriers Identified ? ? ?Patient Goals and CMS Choice ?  ?  ?  ? ?Discharge Placement ?  ?           ?  ?  ?  ?  ? ?Discharge Plan and Services ?  ?  ?           ?  ?  ?  ?  ?  ?  ?  ?  ?  ?  ? ?Social Determinants of Health (SDOH) Interventions ?  ? ? ?Readmission Risk Interventions ?No flowsheet data found. ? ? ? ? ?

## 2021-04-07 NOTE — Procedures (Signed)
Patient Name: Beth Velez  ?MRN: [id number]  ?Epilepsy Attending: Lora Havens  ?Referring Physician/Provider: Donnetta Simpers, MD ?Date: 04/07/2021 ?Duration: 22.37 mins ? ?Patient history: 59 y.o. female with past medical history of HTN, HLD, GERD, anxiety and depression, vertigo and diverticulitis who presents to [facility] ED for cc of double vision and being off balance. She states this happened while urinating on the toilet. This episode lasted for about 5 minutes. She states that these symptoms have occurred about 5 times in the past few months. EEG to evaluate for seizure ? ?Level of alertness: Awake ? ?AEDs during EEG study: LTG ? ?Technical aspects: This EEG study was done with scalp electrodes positioned according to the 10-20 International system of electrode placement. Electrical activity was acquired at a sampling rate of 500Hz  and reviewed with a high frequency filter of 70Hz  and a low frequency filter of 1Hz . EEG data were recorded continuously and digitally stored.  ? ?Description: No clear posterior dominant rhythm was seen. EEG showed an excessive amount of 15 to 18 Hz, 2-3 uV beta activity distributed symmetrically and diffusely. Hyperventilation and photic stimulation were not performed.    ? ?ABNORMALITY ?- Excessive beta, generalized ? ?IMPRESSION: ?This study is within normal limits. The excessive beta activity seen in the background is most likely due to the effect of medications like benzodiazepine and is a benign EEG pattern. No seizures or epileptiform discharges were seen throughout the recording. ? ?Lora Havens  ? ?

## 2021-04-07 NOTE — Progress Notes (Signed)
Echocardiogram ?2D Echocardiogram has been performed. ? ?Devonne Doughty ?04/07/2021, 8:36 AM ?

## 2021-04-07 NOTE — Plan of Care (Signed)
?  Problem: Education: ?Goal: Knowledge of disease or condition will improve ?Outcome: Adequate for Discharge ?Goal: Knowledge of secondary prevention will improve (SELECT ALL) ?Outcome: Adequate for Discharge ?Goal: Knowledge of patient specific risk factors will improve (INDIVIDUALIZE FOR PATIENT) ?Outcome: Adequate for Discharge ?Goal: Individualized Educational Video(s) ?Outcome: Adequate for Discharge ?  ?Problem: Self-Care: ?Goal: Ability to participate in self-care as condition permits will improve ?Outcome: Adequate for Discharge ?  ?Problem: Ischemic Stroke/TIA Tissue Perfusion: ?Goal: Complications of ischemic stroke/TIA will be minimized ?Outcome: Adequate for Discharge ?  ?

## 2021-04-07 NOTE — Progress Notes (Signed)
SLP Cancellation Note ? ?Patient Details ?Name: Beth Velez ?MRN: [id number] ?DOB: 1962/05/02 ? ? ?Cancelled treatment:       Reason Eval/Treat Not Completed: SLP screened, no needs identified, will sign off. MRI brain negative for acute intracranial process; per chart review patient's symptoms have all resolved is being discharged home today. SLP to s/o at this time. Thank you for this referral! ? ? ?Angela Nevin, MA, CCC-SLP ?Speech Therapy ? ?

## 2021-04-07 NOTE — Progress Notes (Signed)
EEG complete - results pending 

## 2021-04-07 NOTE — Progress Notes (Signed)
Explained discharge summary to patient. Reviewed follows up appointments and the next medication administration time. Removed PIV and telemetry. Notified CCMD that the patient is being discharged. Transported to discharge lounge.  ?

## 2021-04-07 NOTE — Discharge Summary (Signed)
PatientPhysician Discharge Summary  ?Beth Velez ZOX:[id number] DOB: 09-16-62 DOA: 04/06/2021 ? ?PCP: Judd Lien, PA-C ? ?Admit date: 04/06/2021 ?Discharge date: 04/07/2021 ? ? ? ?Admitted From: Home ?Disposition: Home ? ?Recommendations for Outpatient Follow-up:  ?Follow up with PCP in 1-2 weeks ?Please obtain BMP/CBC in one week ?Please follow up with your PCP on the following pending results: ?Unresulted Labs (From admission, onward)  ? ?  Start     Ordered  ? 04/13/21 0500  Creatinine, serum  (enoxaparin (LOVENOX)    CrCl >/= 30 ml/min)  Weekly,   R     ?Comments: while on enoxaparin therapy ?  ? 04/06/21 1658  ? 04/07/21 0500  Hemoglobin A1c  (Labs)  Tomorrow morning,   R       ? 04/06/21 1658  ? ?  ?  ? ?  ?  ? ? ?Home Health: None ?Equipment/Devices: None ? ?Discharge Condition: Stable ?CODE STATUS: Full code ?Diet recommendation: Cardiac ? ?Subjective: Seen and examined.  She has no complaints.  She wants to go home as soon as possible. ? ?HPI: Beth Velez is a 59 y.o. female with medical history significant of hypertension, prior lumbar laminectomy for radiculopathy left-sided back pain, hyperlipidemia, hypertension, GERD, depression, anxiety and vertigo presented to [facility] with a complaint of double vision as well as being off balance.  Per patient, she was sitting on the toilet at her home when this happened.  The off-balance complaint lasted only 2 minutes but the double vision lasted longer.  This was her fifth episode.  But this time the double vision lasted longer so she decided to go to the ED.  She did not have any other complaint.  By the time she arrived to the ED, her symptoms had resolved.  Patient states that she is also going through a lot of anxiety since July last year because her husband abandoned her and her 2 artistic daughters.  She is taking care of both the kids by herself.  She went started crying when talking about that.  Currently she has no symptoms.  She has fluent speech and  she already passed swallow evaluation.  When she arrived to the ED, she was hemodynamically stable.  Vitals were stable as well.  Labs are stable as well.  She underwent CT head and CT angiogram of the head and neck, CT head was negative for any acute stroke ?  ? ?Brief/Interim Summary: She was admitted for stroke rule out.  MRI was negative.  She was seen by neurology, based on her history, it did not sound like a TIA but instead it was most likely anxiety and stress related.  However neurology recommended EEG which is completed but results are pending at the time of dictation.  Patient has no complaints, she was assessed by PT OT.  I personally do not expect EEG to show any epileptiform activity or abnormality so patient will be discharged later today unless we see something unexpected on the EEG.  Her LDL however was very elevated so we are discharging her on atorvastatin 40 mg p.o. daily.  Resume anxiety medications and practice biofeedback. ? ?Discharge plan was discussed with patient and/or family member and they verbalized understanding and agreed with it.  ?Discharge Diagnoses:  ?Active Problems: ?  Essential hypertension ?  Hyperlipidemia ?  Stress at home ? ? ? ?Discharge Instructions ? ? ?Allergies as of 04/07/2021   ?No Known Allergies ?  ? ?  ?Medication List  ?  ? ?  TAKE these medications   ? ?acetaminophen 500 MG tablet ?Commonly known as: TYLENOL ?Take 1,000 mg by mouth every 6 (six) hours as needed for moderate pain. ?  ?amLODipine 10 MG tablet ?Commonly known as: NORVASC ?Take 10 mg by mouth daily. ?  ?atorvastatin 40 MG tablet ?Commonly known as: LIPITOR ?Take 1 tablet (40 mg total) by mouth at bedtime. ?  ?estradiol 0.5 MG tablet ?Commonly known as: ESTRACE ?Take 0.5 mg by mouth daily. ?  ?lamoTRIgine 200 MG tablet ?Commonly known as: LAMICTAL ?Take 200 mg by mouth at bedtime. ?  ?pantoprazole 40 MG tablet ?Commonly known as: PROTONIX ?Take 40 mg by mouth 2 (two) times daily. ?  ?Wellbutrin XL 300  MG 24 hr tablet ?Generic drug: buPROPion ?Take 300 mg by mouth daily. ?  ? ?  ? ? Follow-up Information   ? ? Hyman Hopes, Meghan H, PA-C Follow up in 1 week(s).   ?Specialty: Physician Assistant ?Contact information: ?[address] ?[address] ?[place] [address] ?[phone] ? ? ?  ?  ? ?  ?  ? ?  ? ?No Known Allergies ? ?Consultations: Neurology ? ? ?Procedures/Studies: ?CT ANGIO HEAD NECK W WO CM ? ?Result Date: 04/06/2021 ?CLINICAL DATA:  Neuro deficit, persistent/recurrent, CNS neoplasm suspected EXAM: CT ANGIOGRAPHY HEAD AND NECK TECHNIQUE: Multidetector CT imaging of the head and neck was performed using the standard protocol during bolus administration of intravenous contrast. Multiplanar CT image reconstructions and MIPs were obtained to evaluate the vascular anatomy. Carotid stenosis measurements (when applicable) are obtained utilizing NASCET criteria, using the distal internal carotid diameter as the denominator. RADIATION DOSE REDUCTION: This exam was performed according to the departmental dose-optimization program which includes automated exposure control, adjustment of the mA and/or kV according to patient size and/or use of iterative reconstruction technique. CONTRAST:  73mL OMNIPAQUE IOHEXOL 350 MG/ML SOLN COMPARISON:  None. FINDINGS: CT HEAD FINDINGS Brain: No evidence of acute large vascular territory infarction, hemorrhage, hydrocephalus, extra-axial collection or mass lesion/mass effect. Partially empty sella. Vascular: Detailed below. Skull: No acute fracture. Sinuses: Visualized sinuses are clear. Orbits: No acute finding. Review of the MIP images confirms the above findings CTA NECK FINDINGS Aortic arch: Great vessel origins are patent without significant stenosis. Right carotid system: No evidence of dissection, stenosis (50% or greater) or occlusion. Left carotid system: No clear evidence of dissection, stenosis (50% or greater) or occlusion. Mild linear appearing filling defect involving  the left ICA near the skull base may be artifactual and is atypical for dissection. Vertebral arteries: Left dominant. No evidence of dissection, stenosis (50% or greater) or occlusion. Artifact limits evaluation at the C2-C3 level. Skeleton: Mild multilevel degenerative change. Other neck: No evidence of acute abnormality. Upper chest: Visualized lung apices are clear. Review of the MIP images confirms the above findings CTA HEAD FINDINGS Anterior circulation: Bilateral intracranial ICAs, MCAs, and ACAs are patent without proximal hemodynamically significant stenosis. Posterior circulation: Occlusion of the small/non dominant right vertebral artery with some distal reconstitution likely from retrograde flow. Left intradural vertebral artery is patent. Basilar artery and bilateral posterior cerebral arteries are patent without proximal hemodynamically significant stenosis. Bilateral posterior communicating arteries with small P1 PCAs, anatomic variant. No aneurysm identified. Venous sinuses: As permitted by contrast timing, patent. Anatomic variants: Detailed above. Review of the MIP images confirms the above findings IMPRESSION: CT head: 1. No evidence of acute intracranial abnormality. 2. Partially empty sella, which is often a normal anatomic variant but can be associated with idiopathic intracranial  hypertension. CTA: 1. 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 2. Otherwise, no proximal hemodynamically significant stenosis. Findings [facility] via telephone at 1:19 p.m. Electronically Signed   By: Feliberto HartsFrederick S Jones M.D.   On: 04/06/2021 13:22  ? ?MR BRAIN WO CONTRAST ? ?Result Date: 04/06/2021 ?CLINICAL DATA:  Stroke suspected.  Double vision, off balance EXAM: MRI HEAD WITHOUT CONTRAST TECHNIQUE: Multiplanar, multiecho pulse sequences of the brain and surrounding structures were obtained  without intravenous contrast. COMPARISON:  11/13/2009 MRI brain, correlation is also made with CTA head neck 04/06/2021 FINDINGS: Brain: No restricted diffusion to suggest acute or subacute infarct. No acut

## 2021-04-08 LAB — HEMOGLOBIN A1C
Hgb A1c MFr Bld: 5.2 % (ref 4.8–5.6)
Mean Plasma Glucose: 103 mg/dL

## 2022-09-18 ENCOUNTER — Encounter: Payer: Self-pay | Admitting: Neurology

## 2022-10-12 NOTE — Progress Notes (Addendum)
 Office Visit Note  Patient: Beth Velez             Date of Birth: Apr 19, 1962           MRN: [id number]             PCP: Douglass Gerard DEL, PA-C Referring: Douglass Gerard DEL, PA-C Visit Date: 10/13/2022 Occupation: @GUAROCC @  Subjective:  Pain in multiple joints  History of Present Illness: Beth Velez is a 60 y.o. female seen in consultation per request of her PCP.  According to the patient her symptoms started about 2 years ago .  She was under a lot of stress . In April 2024 she started experiencing increased lower back pain, pain in her hips, knees, feet and her hands.  She has noticed intermittent swelling in her knee joints and some popping in her left knee.  She also notices popping and cracking of her right first MTP joint.  She has had surgery on her left and first MTP joint in the past.  She also had lumbar spine surgery May 2020 due to left-sided radiculopathy which improved.  She was recently diagnosed with degenerative disc disease of the cervical spine.  She states she has insomnia due to nocturnal pain.  In April 2024 she lost her eyebrows.  She was evaluated by dermatologist at [facility].  She was diagnosed with alopecia areata and had a steroid injections, oral prednisone and topical agents for the eyebrows.  She is also using Latisse.  She was concerned that she may have an autoimmune disorder which is causing alopecia and joint pain and she was referred to me for further evaluation.  She states last August she started having fever which lasted for about 5 months.  She still gets intermittent fever.  She denies any history of sicca symptoms, raynauds phenomenon, malar rash, photosensitivity or lymphadenopathy.  There is no immediate family member with autoimmune disease.  She is gravida 2, para 2.  There is no history of miscarriages, no history of DVTs    Activities of Daily Living:  Patient reports morning stiffness for all day. Patient Reports nocturnal pain.   Difficulty dressing/grooming: Denies Difficulty climbing stairs: Denies Difficulty getting out of chair: Denies Difficulty using hands for taps, buttons, cutlery, and/or writing: Denies  Review of Systems  Constitutional:  Positive for fatigue and fever.  HENT:  Positive for mouth sores and sore throat. Negative for mouth dryness.   Eyes:  Negative for dryness.  Respiratory:  Negative for shortness of breath.   Cardiovascular:  Negative for chest pain and palpitations.  Gastrointestinal:  Positive for constipation and diarrhea. Negative for blood in stool.  Endocrine: Negative for increased urination.  Genitourinary:  Negative for involuntary urination.  Musculoskeletal:  Positive for joint pain, joint pain, joint swelling and morning stiffness. Negative for gait problem, myalgias, muscle weakness, muscle tenderness and myalgias.  Skin:  Positive for hair loss. Negative for color change, rash and sensitivity to sunlight.  Allergic/Immunologic: Negative for susceptible to infections.  Neurological:  Positive for dizziness and headaches.  Hematological:  Negative for swollen glands.  Psychiatric/Behavioral:  Positive for sleep disturbance. Negative for depressed mood. The patient is nervous/anxious.     PMFS History:  Patient Active Problem List   Diagnosis Date Noted   Hyperlipidemia 04/07/2021   Stress at home 04/07/2021   TIA (transient ischemic attack) 04/06/2021   DEPRESSION 03/18/2007   Diverticulosis of colon 03/18/2007   Headache 03/18/2007   FOOT PAIN,  LEFT 02/18/2007   COUGH 02/13/2007   ABDOMINAL PAIN, ACUTE 10/26/2006   Essential hypertension 10/17/2006   BRONCHITIS NOS 07/10/2006    Past Medical History:  Diagnosis Date   Diverticulitis     Family History  Problem Relation Age of Onset   High Cholesterol Mother    Obesity Mother    Hydrocephalus Mother        normal pressure   Heart attack Father        slient   Dementia Father                                                             Rheum arthritis Cousin    Past Surgical History:  Procedure Laterality Date   ABDOMINAL HYSTERECTOMY     APPENDECTOMY     BACK SURGERY     TOE SURGERY Left    great toe   Social History   Social History Narrative   Not on file   Immunization History  Administered Date(s) Administered   Moderna Sars-Covid-2 Vaccination 08/03/2019, 10/11/2019     Objective: Vital Signs: BP 130/83 (BP Location: Left Arm, Patient Position: Sitting, Cuff Size: Normal)   Pulse 64   Resp 15   Ht 5' 3.5 (1.613 m)   Wt 165 lb 9.6 oz (75.1 kg)   BMI 28.87 kg/m    Physical Exam Vitals and nursing note reviewed.  Constitutional:      Appearance: She is well-developed.  HENT:     Head: Normocephalic and atraumatic.  Eyes:     Conjunctiva/sclera: Conjunctivae normal.  Cardiovascular:     Rate and Rhythm: Normal rate and regular rhythm.     Heart sounds: Normal heart sounds.  Pulmonary:     Effort: Pulmonary effort is normal.     Breath sounds: Normal breath sounds.  Abdominal:     General: Bowel sounds are normal.     Palpations: Abdomen is soft.  Musculoskeletal:     Cervical back: Normal range of motion.  Lymphadenopathy:     Cervical: No cervical adenopathy.  Skin:    General: Skin is warm and dry.     Capillary Refill: Capillary refill takes less than 2 seconds.  Neurological:     Mental Status: She is alert and oriented to person, place, and time.  Psychiatric:        Behavior: Behavior normal.      Musculoskeletal Exam: She had good range of motion of the cervical spine with some discomfort.  Thoracic and lumbar spine were in good range of motion.  Schober's was negative.  There was no SI joint tenderness.  Shoulder joints, elbow joints, wrist joints, MCPs PIPs and DIPs were in good range of motion.  She had bilateral PIP and DIP thickening with no synovitis.  Hip joints were in good range of motion with some discomfort.  She had  tenderness over bilateral trochanteric bursa.  She had discomfort with range of motion of her bilateral knee joints with no warmth swelling or effusion.  There was no tenderness over ankles or MTPs.  No synovitis was noted.  PIP and DIP thickening was noted.  She had multiple tender points and hyperalgesia.  CDAI Exam: CDAI Score: -- Patient Global: --; Provider Global: -- Swollen: --; Tender: -- Joint Exam 10/13/2022  No joint exam has been documented for this visit   There is currently no information documented on the homunculus. Go to the Rheumatology activity and complete the homunculus joint exam.  Investigation: No additional findings.  Imaging: No results found.  Recent Labs: Lab Results  Component Value Date   WBC 7.4 04/06/2021   HGB 12.5 04/06/2021   PLT 364 04/06/2021   NA 137 04/06/2021   K 3.6 04/06/2021   CL 104 04/06/2021   CO2 23 04/06/2021   GLUCOSE 97 04/06/2021   BUN 9 04/06/2021   CREATININE 0.81 04/06/2021   BILITOT 0.5 04/06/2021   ALKPHOS 52 04/06/2021   AST 18 04/06/2021   ALT 17 04/06/2021   PROT 7.7 04/06/2021   ALBUMIN 4.4 04/06/2021   CALCIUM  9.6 04/06/2021   GFRAA >60 07/14/2019   December 06, 2021 CBC normal, CMP normal, March 25, 2022 TSH 0.43 Speciality Comments: No specialty comments available.  Procedures:  No procedures performed Allergies: Atorvastatin    Assessment / Plan:     Visit Diagnoses: Pain in both hands -patient has been experiencing pain and discomfort in her bilateral hands.  No synovitis was noted.  Bilateral PIP and DIP thickening was noted.  I will obtain x-rays and labs today.  A handout on hand exercises was given.  Plan: XR Hand 2 View Right, XR Hand 2 View Left, x-rays of bilateral hands were suggestive of osteoarthritis.  Sedimentation rate, Rheumatoid factor, Cyclic citrul peptide antibody, IgG, ANA, Uric acid  Chronic pain of both hips -she complains of pain and discomfort in her bilateral hips.  Plan: XR HIPS  BILAT W OR W/O PELVIS 3-4 VIEWS.  X-rays of bilateral hips were unremarkable.  Trochanteric bursitis of both hips-she had tenderness over bilateral trochanteric bursa.  IT band stretches were discussed and a handout was given.  Chronic pain of both knees -she complains of pain and discomfort in her bilateral knee joints and intermittent swelling.  No warmth swelling or effusion was noted.  A handout on knee joint exercises was given.  Plan: XR KNEE 3 VIEW RIGHT, XR KNEE 3 VIEW LEFT.  X-rays of bilateral knee joints were unremarkable.  Pain in both feet -she complains of pain and discomfort in her bilateral feet.  Status post left great toe surgery.  No synovitis was noted on the examination today.  PIP and DIP prominence was noted.  First MTP prominence was noted.  Surgical scar over the left great toe was noted.- Plan: XR Foot 2 Views Right, XR Foot 2 Views Left.  X-rays of bilateral feet showed osteoarthritic changes severe osteoarthritis was noted in the left first MTP joint.  Dorsal spurring was noted.  DDD (degenerative disc disease), cervical-patient complains of discomfort in the cervical region.  X-rays of the cervical spine from December 08, 2021 were reviewed which showed multilevel disc disease from C3-T1, read by Dr. Johann.  DDD (degenerative disc disease), lumbar - Laminectomy in 2020.  The left-sided radiculopathy resolved after the lumbar laminectomy.  She continues to have chronic lower back pain.  Alopecia-patient was diagnosed with alopecia areata after losing her eyebrows at [facility].  Patient states with the combination of cortisone cream, Latisse and steroid injections she has noticed improvement.  Fibromyalgia-patient does not recall that she was given a diagnosis of fibromyalgia in the past.  She had generalized hyperalgesia and positive tender points.  Other fatigue -most likely due to fatigue.  Plan: CBC with Differential/Platelet, COMPLETE METABOLIC PANEL WITH  GFR, CK, TSH  Primary insomnia-she has chronic insomnia.  She also has history of anxiety and has been in a lot of distress.  History of IBS-she has constipation not relating with diarrhea.  Other medical problems are listed as follows:  Diverticulosis of colon  Mixed hyperlipidemia  TIA (transient ischemic attack)  Essential hypertension  PTSD (post-traumatic stress disorder)  Anxiety and depression  Attention deficit hyperactivity disorder (ADHD), other type  History of migraine   Orders: Orders Placed This Encounter  Procedures   XR Hand 2 View Right   XR Hand 2 View Left   XR KNEE 3 VIEW RIGHT   XR KNEE 3 VIEW LEFT   XR Foot 2 Views Right   XR Foot 2 Views Left   XR HIPS BILAT W OR W/O PELVIS 3-4 VIEWS   CBC with Differential/Platelet   COMPLETE METABOLIC PANEL WITH GFR   Sedimentation rate   CK   Rheumatoid factor   Cyclic citrul peptide antibody, IgG   ANA   Uric acid   TSH   No orders of the defined types were placed in this encounter.  Addended HPI and family history per patient's request on December 17, 2023  Follow-Up Instructions: Return for Polyarthralgia, alopecia.   Maya Nash, MD  Note - This record has been created using Animal nutritionist.  Chart creation errors have been sought, but may not always  have been located. Such creation errors do not reflect on  the standard of medical care.

## 2022-10-13 ENCOUNTER — Ambulatory Visit: Payer: Medicare HMO

## 2022-10-13 ENCOUNTER — Ambulatory Visit: Payer: Medicare HMO | Attending: Rheumatology | Admitting: Rheumatology

## 2022-10-13 ENCOUNTER — Ambulatory Visit (INDEPENDENT_AMBULATORY_CARE_PROVIDER_SITE_OTHER): Payer: Medicare HMO

## 2022-10-13 ENCOUNTER — Encounter: Payer: Self-pay | Admitting: Rheumatology

## 2022-10-13 VITALS — BP 130/83 | HR 64 | Resp 15 | Ht 63.5 in | Wt 165.6 lb

## 2022-10-13 DIAGNOSIS — M79671 Pain in right foot: Secondary | ICD-10-CM | POA: Diagnosis not present

## 2022-10-13 DIAGNOSIS — M79642 Pain in left hand: Secondary | ICD-10-CM

## 2022-10-13 DIAGNOSIS — R5383 Other fatigue: Secondary | ICD-10-CM

## 2022-10-13 DIAGNOSIS — M25551 Pain in right hip: Secondary | ICD-10-CM | POA: Diagnosis not present

## 2022-10-13 DIAGNOSIS — L659 Nonscarring hair loss, unspecified: Secondary | ICD-10-CM

## 2022-10-13 DIAGNOSIS — M797 Fibromyalgia: Secondary | ICD-10-CM

## 2022-10-13 DIAGNOSIS — Z8719 Personal history of other diseases of the digestive system: Secondary | ICD-10-CM

## 2022-10-13 DIAGNOSIS — G8929 Other chronic pain: Secondary | ICD-10-CM

## 2022-10-13 DIAGNOSIS — Z87898 Personal history of other specified conditions: Secondary | ICD-10-CM

## 2022-10-13 DIAGNOSIS — G459 Transient cerebral ischemic attack, unspecified: Secondary | ICD-10-CM

## 2022-10-13 DIAGNOSIS — F5101 Primary insomnia: Secondary | ICD-10-CM

## 2022-10-13 DIAGNOSIS — M79641 Pain in right hand: Secondary | ICD-10-CM

## 2022-10-13 DIAGNOSIS — M79672 Pain in left foot: Secondary | ICD-10-CM | POA: Diagnosis not present

## 2022-10-13 DIAGNOSIS — Z8669 Personal history of other diseases of the nervous system and sense organs: Secondary | ICD-10-CM

## 2022-10-13 DIAGNOSIS — M25552 Pain in left hip: Secondary | ICD-10-CM | POA: Diagnosis not present

## 2022-10-13 DIAGNOSIS — F4310 Post-traumatic stress disorder, unspecified: Secondary | ICD-10-CM

## 2022-10-13 DIAGNOSIS — I10 Essential (primary) hypertension: Secondary | ICD-10-CM

## 2022-10-13 DIAGNOSIS — M25562 Pain in left knee: Secondary | ICD-10-CM

## 2022-10-13 DIAGNOSIS — M25561 Pain in right knee: Secondary | ICD-10-CM | POA: Diagnosis not present

## 2022-10-13 DIAGNOSIS — M7062 Trochanteric bursitis, left hip: Secondary | ICD-10-CM

## 2022-10-13 DIAGNOSIS — F908 Attention-deficit hyperactivity disorder, other type: Secondary | ICD-10-CM

## 2022-10-13 DIAGNOSIS — F419 Anxiety disorder, unspecified: Secondary | ICD-10-CM

## 2022-10-13 DIAGNOSIS — M5030 Other cervical disc degeneration, unspecified cervical region: Secondary | ICD-10-CM

## 2022-10-13 DIAGNOSIS — K5730 Diverticulosis of large intestine without perforation or abscess without bleeding: Secondary | ICD-10-CM

## 2022-10-13 DIAGNOSIS — F32A Depression, unspecified: Secondary | ICD-10-CM

## 2022-10-13 DIAGNOSIS — E782 Mixed hyperlipidemia: Secondary | ICD-10-CM

## 2022-10-13 DIAGNOSIS — M5136 Other intervertebral disc degeneration, lumbar region: Secondary | ICD-10-CM

## 2022-10-13 DIAGNOSIS — M7061 Trochanteric bursitis, right hip: Secondary | ICD-10-CM

## 2022-10-13 NOTE — Patient Instructions (Signed)
Iliotibial Band Syndrome Rehab Ask your health care provider which exercises are safe for you. Do exercises exactly as told by your provider and adjust them as told. It's normal to feel mild stretching, pulling, tightness, or discomfort as you do these exercises. Stop right away if you feel sudden pain or your pain gets a lot worse. Do not begin these exercises until told by your provider. Stretching and range-of-motion exercises These exercises warm up your muscles and joints. They also improve the movement and flexibility of your hip and pelvis. Quadriceps stretch, prone  Lie face down (prone) on a firm surface like a bed or padded floor. Bend your left / right knee. Reach back to hold your ankle or pant leg. If you can't reach your ankle or pant leg, use a belt looped around your foot and grab the belt instead. Gently pull your heel toward your butt. Your knee should not slide out to the side. You should feel a stretch in the front of your thigh and knee, also called the quadriceps. Hold this position for __________ seconds. Repeat __________ times. Complete this exercise __________ times a day. Iliotibial band stretch The iliotibial band is a strip of tissue that runs along the outside of your hip down to your knee. Lie on your side with your left / right leg on top. Bend both knees and grab your left / right ankle. Stretch out your bottom arm to help you balance. Slowly bring your top knee back so your thigh goes behind your back. Slowly lower your top leg toward the floor until you feel a gentle stretch on the outside of your left / right hip and thigh. If you don't feel a stretch and your knee won't go farther, place the heel of your other foot on top of your knee and pull your knee down toward the floor with your foot. Hold this position for __________ seconds. Repeat __________ times. Complete this exercise __________ times a day. Strengthening exercises These exercises build strength  and endurance in your hip and pelvis. Endurance means your muscles can keep working even when they're tired. Straight leg raises, side-lying This exercise strengthens the muscles that rotate the leg at the hip and move it away from your body. These muscles are called hip abductors. Lie on your side with your left / right leg on top. Lie so your head, shoulder, hip, and knee line up. You can bend your bottom knee to help you balance. Roll your hips slightly forward so they're stacked directly over each other. Your left / right knee should face forward. Tense the muscles in your outer thigh and hip. Lift your top leg 4-6 inches (10-15 cm) off the ground. Hold this position for __________ seconds. Slowly lower your leg back down to the starting position. Let your muscles fully relax before doing this exercise again. Repeat __________ times. Complete this exercise __________ times a day. Leg raises, prone This exercise strengthens the muscles that move the hips backward. These muscles are called hip extensors. Lie face down (prone) on your bed or a firm surface. You can put a pillow under your hips for comfort and to support your lower back. Bend your left / right knee so your foot points straight up toward the ceiling. Keep the other leg straight and behind you. Squeeze your butt muscles. Lift your left / right thigh off the firm surface. Do not let your back arch. Tense your thigh muscle as hard as you can without having  more knee pain. Hold this position for __________ seconds. Slowly lower your leg to the starting position. Allow your leg to relax all the way. Repeat __________ times. Complete this exercise __________ times a day. Hip hike  Stand sideways on a bottom step. Place your feet so that your left / right leg is on the step, and the other foot is hanging off the side. If you need support for balance, hold onto a railing or wall. Keep your knees straight and your abdomen square,  meaning your hips are level. Then, lift your left / right hip up toward the ceiling. Slowly let your leg that's hanging off the step lower towards the floor. Your foot should get closer to the ground. Do not lean or bend your knees during this movement. Repeat __________ times. Complete this exercise __________ times a day. This information is not intended to replace advice given to you by your health care provider. Make sure you discuss any questions you have with your health care provider. Document Revised: 03/24/2022 Document Reviewed: 03/24/2022 Elsevier Patient Education  2024 [employer] Exercises for Chronic Knee Pain Chronic knee pain is pain that lasts longer than 3 months. For most people with chronic knee pain, exercise and weight loss is an important part of treatment. Your health care provider may want you to focus on: Making the muscles that support your knee stronger. This can take pressure off your knee and reduce pain. Preventing knee stiffness. How far you can move your knee, keeping it there or making it farther. Losing weight (if this applies) to take pressure off your knee, lower your risk for injury, and make it easier for you to exercise. Your provider will help you make an exercise program that fits your needs and physical abilities. Below are simple, low-impact exercises you can do at home. Ask your provider or physical therapist how often you should do your exercise program and how many times to repeat each exercise. General safety tips  Get your provider's approval before doing any exercises. Start slowly and stop any time you feel pain. Do not exercise if your knee pain is flaring up. Warm up first. Stretching a cold muscle can cause an injury. Do 5-10 minutes of easy movement or light stretching before beginning your exercises. Do 5-10 minutes of low-impact activity (like walking or cycling) before starting strengthening exercises. Contact your provider any time  you have pain during or after exercising. Exercise can cause discomfort but should not be painful. It is normal to be a little stiff or sore after exercising. Stretching and range-of-motion exercises Front thigh stretch  Stand up straight and support your body by holding on to a chair or resting one hand on a wall. With your legs straight and close together, bend one knee to lift your heel up toward your butt. Using one hand for support, grab your ankle with your free hand. Pull your foot up closer toward your butt to feel the stretch in front of your thigh. Hold the stretch for 30 seconds. Repeat __________ times. Complete this exercise __________ times a day. Back thigh stretch  Sit on the floor with your back straight and your legs out straight in front of you. Place the palms of your hands on the floor and slide them toward your feet as you bend at the hip. Try to touch your nose to your knees and feel the stretch in the back of your thighs. Hold for 30 seconds. Repeat __________ times. Complete this  exercise __________ times a day. Calf stretch  Stand facing a wall. Place the palms of your hands flat against the wall, arms extended, and lean slightly against the wall. Get into a lunge position with one leg bent at the knee and the other leg stretched out straight behind you. Keep both feet facing the wall and increase the bend in your knee while keeping the heel of the other leg flat on the ground. You should feel the stretch in your calf. Hold for 30 seconds. Repeat __________ times. Complete this exercise __________ times a day. Strengthening exercises Straight leg lift  Lie on your back with one knee bent and the other leg out straight. Slowly lift the straight leg without bending the knee. Lift until your foot is about 12 inches (30 cm) off the floor. Hold for 3-5 seconds and slowly lower your leg. Repeat __________ times. Complete this exercise __________ times a  day. Single leg dip  Stand between two chairs and put both hands on the backs of the chairs for support. Extend one leg out straight with your body weight resting on the heel of the standing leg. Slowly bend your standing knee to dip your body to the level that is comfortable for you. Hold for 3-5 seconds. Repeat __________ times. Complete this exercise __________ times a day. Hamstring curls  Stand straight, knees close together, facing the back of a chair. Hold on to the back of a chair with both hands. Keep one leg straight. Bend the other knee while bringing the heel up toward the butt until the knee is bent at a 90-degree angle (right angle). Hold for 3-5 seconds. Repeat __________ times. Complete this exercise __________ times a day. Wall squat  Stand straight with your back, hips, and head against a wall. Step forward one foot at a time with your back still against the wall. Your feet should be 2 feet (61 cm) from the wall at shoulder width. Keeping your back, hips, and head against the wall, slide down the wall to as close to a sitting position as you can get. Hold for 5-10 seconds, then slowly slide back up. Repeat __________ times. Complete this exercise __________ times a day. Step-ups  Stand in front of a sturdy platform or stool that is about 6 inches (15 cm) high. Slowly step up with your left / right foot, keeping your knee in line with your hip and foot. Do not let your knee bend so far that you cannot see your toes. Hold on to a chair for balance, but do not use it for support. Slowly unlock your knee and lower yourself to the starting position. Repeat __________ times. Complete this exercise __________ times a day. Contact a health care provider if: Your exercises cause pain. Your pain is worse after you exercise. Your pain prevents you from doing your exercises. This information is not intended to replace advice given to you by your health care provider. Make sure  you discuss any questions you have with your health care provider. Document Revised: 01/24/2022 Document Reviewed: 01/24/2022 Elsevier Patient Education  2024 [employer] Hand Exercises Hand exercises can be helpful for almost anyone. They can strengthen your hands and improve flexibility and movement. The exercises can also increase blood flow to the hands. These results can make your work and daily tasks easier for you. Hand exercises can be especially helpful for people who have joint pain from arthritis or nerve damage from using their hands over and over.  These exercises can also help people who injure a hand. Exercises Most of these hand exercises are gentle stretching and motion exercises. It is usually safe to do them often throughout the day. Warming up your hands before exercise may help reduce stiffness. You can do this with gentle massage or by placing your hands in warm water for 10-15 minutes. It is normal to feel some stretching, pulling, tightness, or mild discomfort when you begin new exercises. In time, this will improve. Remember to always be careful and stop right away if you feel sudden, very bad pain or your pain gets worse. You want to get better and be safe. Ask your health care provider which exercises are safe for you. Do exercises exactly as told by your provider and adjust them as told. Do not begin these exercises until told by your provider. Knuckle bend or "claw" fist  Stand or sit with your arm, hand, and all five fingers pointed straight up. Make sure to keep your wrist straight. Gently bend your fingers down toward your palm until the tips of your fingers are touching your palm. Keep your big knuckle straight and only bend the small knuckles in your fingers. Hold this position for 10 seconds. Straighten your fingers back to your starting position. Repeat this exercise 5-10 times with each hand. Full finger fist  Stand or sit with your arm, hand, and all five  fingers pointed straight up. Make sure to keep your wrist straight. Gently bend your fingers into your palm until the tips of your fingers are touching the middle of your palm. Hold this position for 10 seconds. Extend your fingers back to your starting position, stretching every joint fully. Repeat this exercise 5-10 times with each hand. Straight fist  Stand or sit with your arm, hand, and all five fingers pointed straight up. Make sure to keep your wrist straight. Gently bend your fingers at the big knuckle, where your fingers meet your hand, and at the middle knuckle. Keep the knuckle at the tips of your fingers straight and try to touch the bottom of your palm. Hold this position for 10 seconds. Extend your fingers back to your starting position, stretching every joint fully. Repeat this exercise 5-10 times with each hand. Tabletop  Stand or sit with your arm, hand, and all five fingers pointed straight up. Make sure to keep your wrist straight. Gently bend your fingers at the big knuckle, where your fingers meet your hand, as far down as you can. Keep the small knuckles in your fingers straight. Think of forming a tabletop with your fingers. Hold this position for 10 seconds. Extend your fingers back to your starting position, stretching every joint fully. Repeat this exercise 5-10 times with each hand. Finger spread  Place your hand flat on a table with your palm facing down. Make sure your wrist stays straight. Spread your fingers and thumb apart from each other as far as you can until you feel a gentle stretch. Hold this position for 10 seconds. Bring your fingers and thumb tight together again. Hold this position for 10 seconds. Repeat this exercise 5-10 times with each hand. Making circles  Stand or sit with your arm, hand, and all five fingers pointed straight up. Make sure to keep your wrist straight. Make a circle by touching the tip of your thumb to the tip of your index  finger. Hold for 10 seconds. Then open your hand wide. Repeat this motion with your thumb and each  of your fingers. Repeat this exercise 5-10 times with each hand. Thumb motion  Sit with your forearm resting on a table and your wrist straight. Your thumb should be facing up toward the ceiling. Keep your fingers relaxed as you move your thumb. Lift your thumb up as high as you can toward the ceiling. Hold for 10 seconds. Bend your thumb across your palm as far as you can, reaching the tip of your thumb for the small finger (pinkie) side of your palm. Hold for 10 seconds. Repeat this exercise 5-10 times with each hand. Grip strengthening  Hold a stress ball or other soft ball in the middle of your hand. Slowly increase the pressure, squeezing the ball as much as you can without causing pain. Think of bringing the tips of your fingers into the middle of your palm. All of your finger joints should bend when doing this exercise. Hold your squeeze for 10 seconds, then relax. Repeat this exercise 5-10 times with each hand. Contact a health care provider if: Your hand pain or discomfort gets much worse when you do an exercise. Your hand pain or discomfort does not improve within 2 hours after you exercise. If you have either of these problems, stop doing these exercises right away. Do not do them again unless your provider says that you can. Get help right away if: You develop sudden, severe hand pain or swelling. If this happens, stop doing these exercises right away. Do not do them again unless your provider says that you can. This information is not intended to replace advice given to you by your health care provider. Make sure you discuss any questions you have with your health care provider. Document Revised: 01/24/2022 Document Reviewed: 01/24/2022 Elsevier Patient Education  2024 [employer].

## 2022-10-15 LAB — COMPLETE METABOLIC PANEL WITH GFR
AG Ratio: 1.8 (calc) (ref 1.0–2.5)
ALT: 20 U/L (ref 6–29)
AST: 17 U/L (ref 10–35)
Albumin: 4.6 g/dL (ref 3.6–5.1)
Alkaline phosphatase (APISO): 52 U/L (ref 37–153)
BUN: 11 mg/dL (ref 7–25)
CO2: 29 mmol/L (ref 20–32)
Calcium: 9.9 mg/dL (ref 8.6–10.4)
Chloride: 101 mmol/L (ref 98–110)
Creat: 0.97 mg/dL (ref 0.50–1.05)
Globulin: 2.6 g/dL (calc) (ref 1.9–3.7)
Glucose, Bld: 86 mg/dL (ref 65–99)
Potassium: 4.4 mmol/L (ref 3.5–5.3)
Sodium: 138 mmol/L (ref 135–146)
Total Bilirubin: 0.6 mg/dL (ref 0.2–1.2)
Total Protein: 7.2 g/dL (ref 6.1–8.1)
eGFR: 67 mL/min/{1.73_m2} (ref >=60)

## 2022-10-15 LAB — CBC WITH DIFFERENTIAL/PLATELET
Absolute Monocytes: 518 cells/uL (ref 200–950)
Basophils Absolute: 57 cells/uL (ref 0–200)
Basophils Relative: 0.7 %
Eosinophils Absolute: 113 cells/uL (ref 15–500)
Eosinophils Relative: 1.4 %
HCT: 38.5 % (ref 35.0–45.0)
Hemoglobin: 12.8 g/dL (ref 11.7–15.5)
Lymphs Abs: 2454 cells/uL (ref 850–3900)
MCH: 30.1 pg (ref 27.0–33.0)
MCHC: 33.2 g/dL (ref 32.0–36.0)
MCV: 90.6 fL (ref 80.0–100.0)
MPV: 9.2 fL (ref 7.5–12.5)
Monocytes Relative: 6.4 %
Neutro Abs: 4957 cells/uL (ref 1500–7800)
Neutrophils Relative %: 61.2 %
Platelets: 336 10*3/uL (ref 140–400)
RBC: 4.25 10*6/uL (ref 3.80–5.10)
RDW: 13.1 % (ref 11.0–15.0)
Total Lymphocyte: 30.3 %
WBC: 8.1 10*3/uL (ref 3.8–10.8)

## 2022-10-15 LAB — ANA: Anti Nuclear Antibody (ANA): NEGATIVE

## 2022-10-15 LAB — URIC ACID: Uric Acid, Serum: 3.9 mg/dL (ref 2.5–7.0)

## 2022-10-15 LAB — CK: Total CK: 49 U/L (ref 29–143)

## 2022-10-15 LAB — SEDIMENTATION RATE: Sed Rate: 9 mm/h (ref 0–30)

## 2022-10-15 LAB — TSH: TSH: 0.84 mIU/L (ref 0.40–4.50)

## 2022-10-15 LAB — RHEUMATOID FACTOR: Rheumatoid fact SerPl-aCnc: <10 IU/mL (ref <14)

## 2022-10-15 LAB — CYCLIC CITRUL PEPTIDE ANTIBODY, IGG: Cyclic Citrullin Peptide Ab: <16 UNITS

## 2022-10-16 NOTE — Progress Notes (Signed)
All the labs are within normal limits.  I will discuss results at the follow-up visit.

## 2022-10-17 NOTE — Telephone Encounter (Signed)
Most of the x-rays were unremarkable and showed only mild early degenerative changes which are age-appropriate.  All the labs obtained at the last visit were unremarkable.  Sedimentation rate was normal and did not show any inflammation.  I agree with following up with the PCP and pain clinic for the treatment of fibromyalgia.  We can cancel the follow-up appointment.

## 2022-11-22 ENCOUNTER — Encounter: Payer: Medicare HMO | Admitting: Rheumatology

## 2022-12-01 NOTE — Progress Notes (Deleted)
Office Visit Note  Patient: Beth Velez             Date of Birth: 1962-06-27           MRN: [id number]             PCP: Judd Lien, PA-C Referring: Judd Lien, PA-C Visit Date: 12/15/2022 Occupation: @GUAROCC @  Subjective:  No chief complaint on file.   History of Present Illness: Beth Velez is a 60 y.o. female ***     Activities of Daily Living:  Patient reports morning stiffness for *** {minute/hour:19697}.   Patient {ACTIONS;DENIES/REPORTS:21021675::"Denies"} nocturnal pain.  Difficulty dressing/grooming: {ACTIONS;DENIES/REPORTS:21021675::"Denies"} Difficulty climbing stairs: {ACTIONS;DENIES/REPORTS:21021675::"Denies"} Difficulty getting out of chair: {ACTIONS;DENIES/REPORTS:21021675::"Denies"} Difficulty using hands for taps, buttons, cutlery, and/or writing: {ACTIONS;DENIES/REPORTS:21021675::"Denies"}  No Rheumatology ROS completed.   PMFS History:  Patient Active Problem List   Diagnosis Date Noted   Hyperlipidemia 04/07/2021   Stress at home 04/07/2021   TIA (transient ischemic attack) 04/06/2021   DEPRESSION 03/18/2007   Diverticulosis of colon 03/18/2007   Headache 03/18/2007   FOOT PAIN, LEFT 02/18/2007   COUGH 02/13/2007   Abdominal pain 10/26/2006   Essential hypertension 10/17/2006   BRONCHITIS NOS 07/10/2006    Past Medical History:  Diagnosis Date   Diverticulitis     Family History  Problem Relation Age of Onset   High Cholesterol Mother    Obesity Mother    Hydrocephalus Mother        normal pressure   Heart attack Father        slient   Dementia Father    High Cholesterol Brother    Healthy Brother    Autism Daughter        high functioning   Depression Daughter    Anxiety disorder Daughter    Autism Daughter        high functioning   Anxiety disorder Daughter    Depression Daughter    Tourette syndrome Daughter    Epilepsy Daughter    Rheum arthritis Cousin    Past Surgical History:  Procedure Laterality Date    ABDOMINAL HYSTERECTOMY     APPENDECTOMY     BACK SURGERY     TOE SURGERY Left    great toe   Social History   Social History Narrative   Not on file   Immunization History  Administered Date(s) Administered   Moderna Sars-Covid-2 Vaccination 08/03/2019, 10/11/2019     Objective: Vital Signs: There were no vitals taken for this visit.   Physical Exam   Musculoskeletal Exam: ***  CDAI Exam: CDAI Score: -- Patient Global: --; Provider Global: -- Swollen: --; Tender: -- Joint Exam 12/15/2022   No joint exam has been documented for this visit   There is currently no information documented on the homunculus. Go to the Rheumatology activity and complete the homunculus joint exam.  Investigation: No additional findings.  Imaging: No results found.  Recent Labs: Lab Results  Component Value Date   WBC 8.1 10/13/2022   HGB 12.8 10/13/2022   PLT 336 10/13/2022   NA 138 10/13/2022   K 4.4 10/13/2022   CL 101 10/13/2022   CO2 29 10/13/2022   GLUCOSE 86 10/13/2022   BUN 11 10/13/2022   CREATININE 0.97 10/13/2022   BILITOT 0.6 10/13/2022   ALKPHOS 52 04/06/2021   AST 17 10/13/2022   ALT 20 10/13/2022   PROT 7.2 10/13/2022   ALBUMIN 4.4 04/06/2021   CALCIUM 9.9 10/13/2022   GFRAA >60  07/14/2019   October 13, 2022 ESR 9, CK 49, RF negative, anti-CCP negative, ANA negative, uric acid 3.9, TSH normal  Speciality Comments: No specialty comments available.  Procedures:  No procedures performed Allergies: Atorvastatin   Assessment / Plan:     Visit Diagnoses: No diagnosis found.  Orders: No orders of the defined types were placed in this encounter.  No orders of the defined types were placed in this encounter.   Face-to-face time spent with patient was *** minutes. Greater than 50% of time was spent in counseling and coordination of care.  Follow-Up Instructions: No follow-ups on file.   Pollyann Savoy, MD  Note - This record has been created using  Animal nutritionist.  Chart creation errors have been sought, but may not always  have been located. Such creation errors do not reflect on  the standard of medical care.

## 2022-12-14 ENCOUNTER — Encounter: Payer: Medicare HMO | Admitting: Rheumatology

## 2022-12-15 ENCOUNTER — Ambulatory Visit: Payer: Medicare HMO | Admitting: Rheumatology

## 2022-12-15 DIAGNOSIS — F5101 Primary insomnia: Secondary | ICD-10-CM

## 2022-12-15 DIAGNOSIS — Z8669 Personal history of other diseases of the nervous system and sense organs: Secondary | ICD-10-CM

## 2022-12-15 DIAGNOSIS — M7061 Trochanteric bursitis, right hip: Secondary | ICD-10-CM

## 2022-12-15 DIAGNOSIS — R5383 Other fatigue: Secondary | ICD-10-CM

## 2022-12-15 DIAGNOSIS — Z8719 Personal history of other diseases of the digestive system: Secondary | ICD-10-CM

## 2022-12-15 DIAGNOSIS — M797 Fibromyalgia: Secondary | ICD-10-CM

## 2022-12-15 DIAGNOSIS — E782 Mixed hyperlipidemia: Secondary | ICD-10-CM

## 2022-12-15 DIAGNOSIS — K5730 Diverticulosis of large intestine without perforation or abscess without bleeding: Secondary | ICD-10-CM

## 2022-12-15 DIAGNOSIS — M19041 Primary osteoarthritis, right hand: Secondary | ICD-10-CM

## 2022-12-15 DIAGNOSIS — I10 Essential (primary) hypertension: Secondary | ICD-10-CM

## 2022-12-15 DIAGNOSIS — F4310 Post-traumatic stress disorder, unspecified: Secondary | ICD-10-CM

## 2022-12-15 DIAGNOSIS — F419 Anxiety disorder, unspecified: Secondary | ICD-10-CM

## 2022-12-15 DIAGNOSIS — M51369 Other intervertebral disc degeneration, lumbar region without mention of lumbar back pain or lower extremity pain: Secondary | ICD-10-CM

## 2022-12-15 DIAGNOSIS — G8929 Other chronic pain: Secondary | ICD-10-CM

## 2022-12-15 DIAGNOSIS — L659 Nonscarring hair loss, unspecified: Secondary | ICD-10-CM

## 2022-12-15 DIAGNOSIS — M5030 Other cervical disc degeneration, unspecified cervical region: Secondary | ICD-10-CM

## 2022-12-15 DIAGNOSIS — G459 Transient cerebral ischemic attack, unspecified: Secondary | ICD-10-CM

## 2022-12-15 DIAGNOSIS — M19071 Primary osteoarthritis, right ankle and foot: Secondary | ICD-10-CM

## 2023-01-12 ENCOUNTER — Ambulatory Visit: Payer: Medicare HMO | Admitting: Rheumatology

## 2023-02-06 ENCOUNTER — Ambulatory Visit: Payer: Medicare HMO | Admitting: Neurology

## 2023-04-24 ENCOUNTER — Telehealth: Payer: Self-pay | Admitting: Internal Medicine

## 2023-04-24 NOTE — Telephone Encounter (Signed)
 Good afternoon Dr. Rhea Belton   Doc of Day PM 4/1 PM   We received a call from this patient wishing to schedule an appointment for a colonoscopy. Patient was last seen in 11/2022 for a consultation for GERD and also a colonoscopy. Patient would like to schedule with our practice due to being local and also being referred. Records are in Epic for you to review. Would you please review and advise on scheduling?  Thank you.

## 2023-04-26 NOTE — Telephone Encounter (Signed)
 Pt with hx of gerd and referred for colon cancer screening colonoscopy Unclear if EGD is warranted, and thus okay for office visit with me or app to discuss GERD, possible EGD and screening colon JMP

## 2023-05-07 ENCOUNTER — Encounter: Payer: Self-pay | Admitting: Nurse Practitioner

## 2023-05-07 NOTE — Telephone Encounter (Signed)
 Patient was scheduled for 6/11 at 9:30 AM.

## 2023-07-04 ENCOUNTER — Ambulatory Visit: Admitting: Nurse Practitioner

## 2023-07-05 ENCOUNTER — Encounter: Payer: Self-pay | Admitting: Physician Assistant

## 2023-08-19 NOTE — Progress Notes (Unsigned)
 Ellouise Console, PA-C [address] [place], [place]  [address] Phone: [phone]   Gastroenterology Consultation  Referring Provider:     Douglass Gerard VEAR DEVONNA Primary Care Physician:  Douglass Gerard VEAR DEVONNA Primary Gastroenterologist:  Ellouise Console, PA-C / Dr. Gordy Starch  Reason for Consultation:     Discuss colonoscopy        HPI:   Beth Velez is a 61 y.o. y/o female referred for consultation & management  by Douglass Gerard VEAR, PA-C.    Patient was seen at [facility] to evaluate GERD in 2023 and 2024.  It was recommended that she schedule EGD and colonoscopy, however she did not complete.  She is requesting to transfer care to our office.    She reports being on pantoprazole  40 Mg twice daily for 20 years.  She has chronic heartburn with frequent episodes of breakthrough GERD on high-dose PPI.  She has never had an EGD.  She reports having a normal colonoscopy over 10 years ago.  Past Medical History:  Diagnosis Date   Diverticulitis     Past Surgical History:  Procedure Laterality Date   ABDOMINAL HYSTERECTOMY     APPENDECTOMY     BACK SURGERY     TOE SURGERY Left    great toe    Prior to Admission medications   Medication Sig Start Date End Date Taking? Authorizing Provider  acetaminophen  (TYLENOL ) 500 MG tablet Take 1,000 mg by mouth every 6 (six) hours as needed for moderate pain.    [provider]  bimatoprost (LATISSE) 0.03 % ophthalmic solution APPLY 1 APPLICATION DAILY TO THE AFFTED AREAS 08/24/22   [provider]  clonazePAM  (KLONOPIN ) 1 MG tablet Take 1 mg by mouth 3 (three) times daily as needed. 09/24/22   [provider]  desonide (DESOWEN) 0.05 % cream Apply topically 2 (two) times daily. 05/10/22   [provider]  diclofenac Sodium (VOLTAREN) 1 % GEL 2 grams two to four times daily 09/04/22   [provider]  gabapentin  (NEURONTIN ) 300 MG capsule Take 300 mg by mouth as needed. 09/29/22    [provider]  lamoTRIgine  (LAMICTAL ) 200 MG tablet Take 200 mg by mouth at bedtime. 06/24/19   [provider]  meclizine (ANTIVERT) 25 MG tablet Take 25 mg by mouth as needed. 09/14/22   [provider]  pantoprazole  (PROTONIX ) 40 MG tablet Take 40 mg by mouth 2 (two) times daily. 07/04/19   [provider]  rosuvastatin (CRESTOR) 5 MG tablet Take 1 tablet by mouth daily. 05/27/21   [provider]  traZODone (DESYREL) 100 MG tablet Take by mouth.    [provider]    Family History  Problem Relation Age of Onset   High Cholesterol Mother    Obesity Mother    Hydrocephalus Mother        normal pressure   Heart attack Father        slient   Dementia Father    High Cholesterol Brother    Healthy Brother    Autism Daughter        high functioning   Depression Daughter    Anxiety disorder Daughter    Autism Daughter        high functioning   Anxiety disorder Daughter    Depression Daughter    Tourette syndrome Daughter    Epilepsy Daughter    Rheum arthritis Cousin  Social History   Tobacco Use   Smoking status: Never    Passive exposure: Never   Smokeless tobacco: Never  Vaping Use   Vaping status: Never Used  Substance Use Topics   Alcohol use: No   Drug use: No    Allergies as of 08/20/2023 - Review Complete 10/13/2022  Allergen Reaction Noted   Atorvastatin  Other (See Comments) 06/15/2020    Review of Systems:    All systems reviewed and negative except where noted in HPI.   Physical Exam:  There were no vitals taken for this visit. No LMP recorded. Patient has had a hysterectomy.  General:   Alert,  Well-developed, well-nourished, pleasant and cooperative in NAD Lungs:  Respirations even and unlabored.  Clear throughout to auscultation.   No wheezes, crackles, or rhonchi. No acute distress. Heart:  Regular rate and rhythm; no murmurs, clicks, rubs, or gallops. Abdomen:  Normal bowel sounds.  No  bruits.  Soft, and non-distended without masses, hepatosplenomegaly or hernias noted.  No Tenderness.  No guarding or rebound tenderness.    Neurologic:  Alert and oriented x3;  grossly normal neurologically. Psych:  Alert and cooperative. Normal mood and affect.  Imaging Studies: No results found.  Labs: CBC    Component Value Date/Time   WBC 8.1 10/13/2022 1116   RBC 4.25 10/13/2022 1116   HGB 12.8 10/13/2022 1116   HCT 38.5 10/13/2022 1116   PLT 336 10/13/2022 1116   MCV 90.6 10/13/2022 1116   MCH 30.1 10/13/2022 1116   MCHC 33.2 10/13/2022 1116   RDW 13.1 10/13/2022 1116   LYMPHSABS 2,454 10/13/2022 1116   MONOABS 0.4 04/06/2021 1141   EOSABS 113 10/13/2022 1116   BASOSABS 57 10/13/2022 1116    CMP     Component Value Date/Time   NA 138 10/13/2022 1116   K 4.4 10/13/2022 1116   CL 101 10/13/2022 1116   CO2 29 10/13/2022 1116   GLUCOSE 86 10/13/2022 1116   GLUCOSE 119 (H) 11/17/2005 1448   BUN 11 10/13/2022 1116   CREATININE 0.97 10/13/2022 1116   CALCIUM  9.9 10/13/2022 1116   PROT 7.2 10/13/2022 1116   ALBUMIN 4.4 04/06/2021 1141   AST 17 10/13/2022 1116   ALT 20 10/13/2022 1116   ALKPHOS 52 04/06/2021 1141   BILITOT 0.6 10/13/2022 1116   GFRNONAA >60 04/06/2021 1755   GFRAA >60 07/14/2019 1033    Assessment and Plan:   Beth Velez is a 61 y.o. y/o female has been referred for   1.  Chronic GERD for over 20 years, not controlled on high-dose PPI. - Scheduling EGD I discussed risks of EGD with patient to include risk of bleeding, perforation, and risk of sedation.  Patient expressed understanding and agrees to proceed with EGD.  - Recommend Lifestyle Modifications to prevent Acid Reflux.  Rec. Avoid coffee, sodas, peppermint, garlic, onions, alcohol, citrus fruits, chocolate, tomatoes, fatty and spicey foods.  Avoid eating 2-3 hours before bedtime.    2.  Colon cancer screening; due for a 10-year repeat screening colonoscopy - Scheduling Colonoscopy I  discussed risks of colonoscopy with patient to include risk of bleeding, colon perforation, and risk of sedation.  Patient expressed understanding and agrees to proceed with colonoscopy.   Follow up ***  Ellouise Console, PA-C

## 2023-08-20 ENCOUNTER — Ambulatory Visit: Admitting: Physician Assistant

## 2023-08-20 ENCOUNTER — Encounter: Payer: Self-pay | Admitting: Physician Assistant

## 2023-08-20 VITALS — BP 130/84 | HR 136 | Ht 63.5 in | Wt 162.0 lb

## 2023-08-20 DIAGNOSIS — Z1211 Encounter for screening for malignant neoplasm of colon: Secondary | ICD-10-CM

## 2023-08-20 DIAGNOSIS — K5909 Other constipation: Secondary | ICD-10-CM

## 2023-08-20 DIAGNOSIS — K219 Gastro-esophageal reflux disease without esophagitis: Secondary | ICD-10-CM

## 2023-08-20 DIAGNOSIS — K5904 Chronic idiopathic constipation: Secondary | ICD-10-CM

## 2023-08-20 MED ORDER — FAMOTIDINE 40 MG PO TABS: 40.0000 mg | ORAL_TABLET | Freq: Every day | ORAL | 3 refills | Status: DC

## 2023-08-20 NOTE — Patient Instructions (Addendum)
-   Continue Pantoprazole  40mg  1 tablet every morning for Acid Reflux,  - Add Rx Famotidine  40mg  once daily at bedtime for Acid Reflux. - Take Miralax as needed for constipation.  Recommend Lifestyle Modifications to prevent Acid Reflux.  Rec. Avoid coffee, sodas, peppermint, garlic, onions, alcohol, citrus fruits, chocolate, tomatoes, fatty and spicey foods.  Avoid eating 2-3 hours before bedtime.    Please give us  a call to schedule you for an Endoscopy and Colonoscopy. Please follow the written instructions given to you at your visit today.  If you use inhalers (even only as needed), please bring them with you on the day of your procedure.  DO NOT TAKE 7 DAYS PRIOR TO TEST- Trulicity (dulaglutide) Ozempic, Wegovy (semaglutide) Mounjaro (tirzepatide) Bydureon Bcise (exanatide extended release)  DO NOT TAKE 1 DAY PRIOR TO YOUR TEST Rybelsus (semaglutide) Adlyxin (lixisenatide) Victoza (liraglutide) Byetta (exanatide) ___________________________________________________________________________  Please follow up sooner if symptoms increase or worsen __________________________________________________________________________  Due to recent changes in healthcare laws, you may see the results of your imaging and laboratory studies on MyChart before your provider has had a chance to review them.  We understand that in some cases there may be results that are confusing or concerning to you. Not all laboratory results come back in the same time frame and the provider may be waiting for multiple results in order to interpret others.  Please give us  48 hours in order for your provider to thoroughly review all the results before contacting the office for clarification of your results.   Thank you for trusting me with your gastrointestinal care!   Ellouise Console, PA-C _______________________________________________________  If your blood pressure at your visit was 140/90 or greater, please contact  your primary care physician to follow up on this.  _______________________________________________________  If you are age 49 or older, your body mass index should be between 23-30. Your Body mass index is 28.25 kg/m. If this is out of the aforementioned range listed, please consider follow up with your Primary Care Provider.  If you are age 61 or younger, your body mass index should be between 19-25. Your Body mass index is 28.25 kg/m. If this is out of the aformentioned range listed, please consider follow up with your Primary Care Provider.   ________________________________________________________  The [facility] providers would like to encourage you to use MYCHART to communicate with providers for non-urgent requests or questions.  Due to long hold times on the telephone, sending your provider a message by [facility] may be a faster and more efficient way to get a response.  Please allow 48 business hours for a response.  Please remember that this is for non-urgent requests.  _______________________________________________________

## 2023-08-27 ENCOUNTER — Telehealth: Payer: Self-pay | Admitting: Physician Assistant

## 2023-08-27 DIAGNOSIS — Z1211 Encounter for screening for malignant neoplasm of colon: Secondary | ICD-10-CM

## 2023-08-27 DIAGNOSIS — K219 Gastro-esophageal reflux disease without esophagitis: Secondary | ICD-10-CM

## 2023-08-27 MED ORDER — NA SULFATE-K SULFATE-MG SULF 17.5-3.13-1.6 GM/177ML PO SOLN: ORAL | 0 refills | Status: DC

## 2023-08-27 NOTE — Telephone Encounter (Signed)
 Patient called and stated that she was last seen on July the 28 th and was wondering if she could speak to heavenly or the nurse in regards to her EGD question. Patient is requesting a call back. Please advise.

## 2023-08-27 NOTE — Telephone Encounter (Signed)
 Spoke to patient who would like to go ahead and schedule endoscopy and colonoscopy as suggested at her recent office visit with Ellouise. Patient has been scheduled for Monday, 10/29/23 at 2 pm with Dr Albertus in [facility]. Patient has been advised of time/date/location for upcoming procedure and written instructions made available to the patient for review through mychart. She is to reach out with any questions. Suprep rx sent to pharmacy.

## 2023-09-17 ENCOUNTER — Ambulatory Visit: Attending: Orthopaedic Surgery | Admitting: Physical Therapy

## 2023-09-17 ENCOUNTER — Encounter: Payer: Self-pay | Admitting: Physical Therapy

## 2023-09-17 ENCOUNTER — Other Ambulatory Visit: Payer: Self-pay

## 2023-09-17 DIAGNOSIS — G8929 Other chronic pain: Secondary | ICD-10-CM | POA: Insufficient documentation

## 2023-09-17 DIAGNOSIS — M6281 Muscle weakness (generalized): Secondary | ICD-10-CM | POA: Diagnosis present

## 2023-09-17 DIAGNOSIS — M25561 Pain in right knee: Secondary | ICD-10-CM | POA: Diagnosis present

## 2023-09-17 DIAGNOSIS — M25562 Pain in left knee: Secondary | ICD-10-CM | POA: Insufficient documentation

## 2023-09-17 DIAGNOSIS — M7099 Unspecified soft tissue disorder related to use, overuse and pressure multiple sites: Secondary | ICD-10-CM | POA: Diagnosis present

## 2023-09-17 NOTE — Therapy (Signed)
 OUTPATIENT PHYSICAL THERAPY LOWER EXTREMITY EVALUATION   Patient Name: Beth Velez MRN: [id number] DOB:04/19/62, 61 y.o., female Today's Date: 09/17/2023  END OF SESSION:  PT End of Session - 09/17/23 1705     Visit Number 1    Number of Visits 13    Date for PT Re-Evaluation 10/29/23    PT Start Time 1530    PT Stop Time 1615    PT Time Calculation (min) 45 min    Activity Tolerance Patient tolerated treatment well    Behavior During Therapy [facility] for tasks assessed/performed          Past Medical History:  Diagnosis Date   Diverticulitis    Past Surgical History:  Procedure Laterality Date   ABDOMINAL HYSTERECTOMY     APPENDECTOMY     BACK SURGERY     TOE SURGERY Left    great toe   Patient Active Problem List   Diagnosis Date Noted   Hyperlipidemia 04/07/2021   Stress at home 04/07/2021   TIA (transient ischemic attack) 04/06/2021   DEPRESSION 03/18/2007   Diverticulosis of colon 03/18/2007   Headache 03/18/2007   FOOT PAIN, LEFT 02/18/2007   COUGH 02/13/2007   Abdominal pain 10/26/2006   Essential hypertension 10/17/2006   BRONCHITIS NOS 07/10/2006    PCP: Douglass Gerard DEL, Pa-C  REFERRING PROVIDER: Inocencio Agent, MD  REFERRING DIAG: M22.2X1,M22.2X2 (ICD-10-CM) - Patellofemoral pain syndrome of both knees   Rationale for Evaluation and Treatment: Rehabilitation  THERAPY DIAG:  Chronic pain of both knees  Unspecified soft tissue disorder related to use, overuse and pressure multiple sites  Muscle weakness (generalized)  PERTINENT HISTORY: HTN, TIA,   WEIGHT BEARING RESTRICTIONS: No  FALLS:  Has patient fallen in last 6 months? 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   PRECAUTIONS:  None ---------------------------------------------------------------------------------------------  SUBJECTIVE:   SUBJECTIVE STATEMENT: Eval statement 09/17/2023: has been having B knee pain for 17h months. Had back surgery in 2020. B Knee pain started one month after packing up her house. Pain is similar to pins/needles making foot  and anterior R knee numb. L side is 2/10, R knee keeps getting more painful, 10/10 today  RED FLAGS: None   PLOF: Independent  PATIENT GOALS: get my life back  NEXT MD VISIT: schedule as needed. ---------------------------------------------------------------------------------------------  OBJECTIVE:  Note: Objective measures were completed at Evaluation unless otherwise noted.  DIAGNOSTIC FINDINGS: MRI from pt phone, unremarkable results aside from some R knee meniscal fraying  PATIENT SURVEYS:  LEFS: 27/80  COGNITION: Overall cognitive status: Within functional limits for tasks assessed     SENSATION: Light touch: Impaired  anterior R knee, global R foot  EDEMA:  Minimal around R lateral knee  MUSCLE LENGTH: Hamstrings: Right 0 deg; Left 0 deg Thomas test: B tightness of 2/1 joint mms.  POSTURE: No Significant postural limitations  PALPATION: Tenderness to Adductor magnus/gracilis/sartorius with similar symptom reproduction.  LOWER EXTREMITY ROM:  Active ROM Right eval Left eval  Hip flexion    Hip extension    Hip abduction    Hip adduction    Hip internal rotation    Hip external rotation    Knee flexion    Knee extension    Ankle dorsiflexion    Ankle plantarflexion    Ankle inversion    Ankle  eversion     (Blank rows = not tested)  ! Indicates pain with testing  LOWER EXTREMITY MMT:  MMT Right eval Left eval  Hip flexion 4- 4-  Hip extension    Hip abduction    Hip adduction 4 4  Hip internal rotation    Hip external rotation    Knee flexion 3+ 4-  Knee extension 4+ 4+  Ankle dorsiflexion    Ankle  plantarflexion    Ankle inversion    Ankle eversion     (Blank rows = not tested)  ! Indicates pain with testing  LOWER EXTREMITY SPECIAL TESTS:  Knee special tests: Apley's compression test: positive , Apley's distraction test: positive , Patellafemoral apprehension test: negative, and Patella tap test (ballotable patella): negative  FUNCTIONAL TESTS:    GAIT: Distance walked: 147ft Assistive device utilized: None Level of assistance: Complete Independence Comments: no AD, [facility]                                                                                                                                [facility] Adult PT Treatment:                                                DATE: 09/17/2023 Self Care: Pt education, detailed below POC discussion    PATIENT EDUCATION:  Education details: Pt received education regarding HEP performance, ADL performance, functional activity tolerance, impairment education, appropriate performance of therapeutic activities. Person educated: Patient Education method: Explanation, Demonstration, Tactile cues, Verbal cues, and Handouts Education comprehension: verbalized understanding and returned demonstration  HOME EXERCISE PROGRAM: Standing adductor stretch 1-3 times daily, 10s hold 2x5 B, add to this next session. ---------------------------------------------------------------------------------------------  ASSESSMENT:  CLINICAL IMPRESSION: Eval impression (09/17/2023): Pt. attended today's physical therapy session for evaluation of B knee pain. Pt has complaints of B knee pain onset 17 months ago following moving from a house to an apartment. Pt gives reports of anterior knee pins and needles with occasional sharp pain into the foot. Pt has notable deficits and would benefit from therapeutic focus on Adductor motility/strengthening, knee stability, hamstring strength, and saphenous nerve motility.  Signs and symptoms are concurrent with hunters  canal entrapment. Treatment performed today focused on pt education detailed above. Pt demonstrated good understanding of education provided. required moderate v/t cues and no assistance for appropriate performance with today's activities. F/u and add to HEP next session, was limited at eval d/t time constraints. Pt requires the intervention of skilled outpatient physical therapy to address the aforementioned deficits and progress towards a functional level in line with therapeutic goals.   OBJECTIVE IMPAIRMENTS: decreased activity tolerance, decreased mobility, difficulty walking, decreased ROM, decreased strength, impaired perceived functional ability, impaired sensation, impaired tone, improper body mechanics, and pain.   ACTIVITY LIMITATIONS: sitting, standing, squatting, sleeping,  stairs, dressing, locomotion level, and caring for others  PARTICIPATION LIMITATIONS: cleaning, laundry, interpersonal relationship, driving, shopping, community activity, and occupation  PERSONAL FACTORS: Behavior pattern, Fitness, Profession, and Time since onset of injury/illness/exacerbation are also affecting patient's functional outcome.   REHAB POTENTIAL: Fair hesitance with PT  CLINICAL DECISION MAKING: Evolving/moderate complexity  EVALUATION COMPLEXITY: Low   GOALS: Goals reviewed with patient? YES  SHORT TERM GOALS: Target date: 10/08/2023  Pt will be independent with administered HEP to demonstrate the competency necessary for long term managemnet of symptoms at home.  Baseline: Goal status: INITIAL LONG TERM GOALS: Target date: 10/29/2023  Pt. Will achieve a LEFS score of 40 as to demonstrate improvement in self-perceived functional ability with daily activities.  Baseline: 27/80 Goal status: INITIAL  2.  Pt will improve Global Hip/knee strength to a 4+/5 to demonstrate improvement in strength for quality of motion and activity performance. Baseline:  Goal status: INITIAL  3.  Pt will  report the ability to stand for >/= 30 minutes as to demonstrate improved tolerance to standing for prolonged time and improved ability to participate in work tasks ans ADLs.  Baseline:  Goal status: INITIAL  4.  Pt will perform a squat to parallel with less than 4/10 pain in BLE as to demonstrate improved BLE strength, coordination, functional mobility, and activity tolerance necessary for safe return to gym activities. Baseline:  Goal status: INITIAL  --------------------------------------------------------------------------------------------- PLAN:  PT FREQUENCY: 1-2x/week  PT DURATION: 6 weeks  PLANNED INTERVENTIONS: 97110-Therapeutic exercises, 97530- Therapeutic activity, 97112- Neuromuscular re-education, [phone]- Self Care, [id number]- Manual therapy, [phone]- Gait training, [phone]- Aquatic Therapy, Patient/Family education, Balance training, Stair training, Taping, Joint mobilization, DME instructions, and Moist heat  PLAN FOR NEXT SESSION: Review HEP, Begin POC as detailed in the assessment   Mabel Kiang, PT, DPT 09/17/2023, 5:07 PM

## 2023-09-26 ENCOUNTER — Ambulatory Visit: Admitting: Physical Therapy

## 2023-10-03 ENCOUNTER — Ambulatory Visit: Attending: Orthopaedic Surgery

## 2023-10-03 DIAGNOSIS — M25562 Pain in left knee: Secondary | ICD-10-CM | POA: Diagnosis present

## 2023-10-03 DIAGNOSIS — M6281 Muscle weakness (generalized): Secondary | ICD-10-CM | POA: Insufficient documentation

## 2023-10-03 DIAGNOSIS — G8929 Other chronic pain: Secondary | ICD-10-CM | POA: Insufficient documentation

## 2023-10-03 DIAGNOSIS — M7099 Unspecified soft tissue disorder related to use, overuse and pressure multiple sites: Secondary | ICD-10-CM | POA: Insufficient documentation

## 2023-10-03 DIAGNOSIS — M25561 Pain in right knee: Secondary | ICD-10-CM | POA: Insufficient documentation

## 2023-10-03 NOTE — Therapy (Signed)
 OUTPATIENT PHYSICAL THERAPY TREATMENT NOTE   Patient Name: Beth Velez MRN: [id number] DOB:20-Oct-1962, 61 y.o., female Today's Date: 10/03/2023  END OF SESSION:  PT End of Session - 10/03/23 1527     Visit Number 2    Number of Visits 13    Date for PT Re-Evaluation 10/29/23    PT Start Time 1530    PT Stop Time 1610    PT Time Calculation (min) 40 min    Activity Tolerance Patient tolerated treatment well;Patient limited by pain    Behavior During Therapy [facility] for tasks assessed/performed          Past Medical History:  Diagnosis Date   Diverticulitis    Past Surgical History:  Procedure Laterality Date   ABDOMINAL HYSTERECTOMY     APPENDECTOMY     BACK SURGERY     TOE SURGERY Left    great toe   Patient Active Problem List   Diagnosis Date Noted   Hyperlipidemia 04/07/2021   Stress at home 04/07/2021   TIA (transient ischemic attack) 04/06/2021   DEPRESSION 03/18/2007   Diverticulosis of colon 03/18/2007   Headache 03/18/2007   FOOT PAIN, LEFT 02/18/2007   COUGH 02/13/2007   Abdominal pain 10/26/2006   Essential hypertension 10/17/2006   BRONCHITIS NOS 07/10/2006    PCP: Douglass Gerard DEL, Pa-C  REFERRING PROVIDER: Inocencio Agent, MD  REFERRING DIAG: M22.2X1,M22.2X2 (ICD-10-CM) - Patellofemoral pain syndrome of both knees   Rationale for Evaluation and Treatment: Rehabilitation  THERAPY DIAG:  Chronic pain of both knees  Unspecified soft tissue disorder related to use, overuse and pressure multiple sites  Muscle weakness (generalized)  PERTINENT HISTORY: HTN, TIA,   WEIGHT BEARING RESTRICTIONS: No  FALLS:  Has patient fallen in last 6 months? 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   PRECAUTIONS:  None ---------------------------------------------------------------------------------------------  SUBJECTIVE:   SUBJECTIVE STATEMENT: Patient reports that she fell onto her right side last week, onto her knee and shoulder. She cancelled her appointment last week due to this. She reports increased pain in her right knee, shoulder, and back.  Eval statement 09/17/2023: has been having B knee pain for 17h months. Had back surgery in 2020. B Knee pain started one month after packing up her house. Pain is similar to pins/needles making foot  and anterior R knee numb. L side is 2/10, R knee keeps getting more painful, 10/10 today  RED FLAGS: None   PLOF: Independent  PATIENT GOALS: get my life back  NEXT MD VISIT: schedule as needed. ---------------------------------------------------------------------------------------------  OBJECTIVE:  Note: Objective measures were completed at Evaluation unless otherwise noted.  DIAGNOSTIC FINDINGS: MRI from pt phone, unremarkable results aside from some R knee meniscal fraying  PATIENT SURVEYS:  LEFS: 27/80  COGNITION: Overall cognitive status: Within functional limits for tasks assessed     SENSATION: Light touch: Impaired  anterior R knee, global R foot  EDEMA:  Minimal around R lateral knee  MUSCLE LENGTH: Hamstrings: Right 0 deg; Left 0 deg Thomas test: B tightness of 2/1 joint mms.  POSTURE: No Significant postural limitations  PALPATION: Tenderness to Adductor magnus/gracilis/sartorius with similar symptom reproduction.  LOWER EXTREMITY ROM:  Active ROM Right eval Left eval  Hip flexion    Hip extension    Hip abduction    Hip adduction  Hip internal rotation    Hip external rotation    Knee flexion    Knee extension    Ankle dorsiflexion    Ankle plantarflexion    Ankle inversion    Ankle eversion     (Blank rows = not tested)  ! Indicates pain with testing  LOWER EXTREMITY MMT:  MMT Right eval  Left eval  Hip flexion 4- 4-  Hip extension    Hip abduction    Hip adduction 4 4  Hip internal rotation    Hip external rotation    Knee flexion 3+ 4-  Knee extension 4+ 4+  Ankle dorsiflexion    Ankle plantarflexion    Ankle inversion    Ankle eversion     (Blank rows = not tested)  ! Indicates pain with testing  LOWER EXTREMITY SPECIAL TESTS:  Knee special tests: Apley's compression test: positive , Apley's distraction test: positive , Patellafemoral apprehension test: negative, and Patella tap test (ballotable patella): negative  FUNCTIONAL TESTS:    GAIT: Distance walked: 123ft Assistive device utilized: None Level of assistance: Complete Independence Comments: no AD, WFL   [facility] Adult PT Treatment:                                                DATE: 10/03/23 Therapeutic Exercise: Nustep level 3 x 8 mins while gathering subjective and planning session with patient Seated hamstring stretch x30 BIL Seated hip adduction 5 hold 2x10 Supine butterfly stretch x1 4# on knees (pain in right knee) Supine SLR with ER 2x10 BIL                                                                                                                               [facility] Adult PT Treatment:                                                DATE: 09/17/2023 Self Care: Pt education, detailed below POC discussion   PATIENT EDUCATION:  Education details: Pt received education regarding HEP performance, ADL performance, functional activity tolerance, impairment education, appropriate performance of therapeutic activities. Person educated: Patient Education method: Explanation, Demonstration, Tactile cues, Verbal cues, and Handouts Education comprehension: verbalized understanding and returned demonstration  HOME EXERCISE PROGRAM: Standing adductor stretch 1-3 times daily, 10s hold 2x5 B, add to this next  session. ---------------------------------------------------------------------------------------------  ASSESSMENT:  CLINICAL IMPRESSION: Patient presents to first follow up PT session reporting that she fell last week onto her right side and has had increased pain in her knee since then. She is very frustrated with the orthopedic doctors she has seen thus far due to lack of communication. Session today focused on  strengthening and stretching, mostly for RLE. Patient continues to benefit from skilled PT services and should be progressed as able to improve functional independence.   Eval impression (09/17/2023): Pt. attended today's physical therapy session for evaluation of B knee pain. Pt has complaints of B knee pain onset 17 months ago following moving from a house to an apartment. Pt gives reports of anterior knee pins and needles with occasional sharp pain into the foot. Pt has notable deficits and would benefit from therapeutic focus on Adductor motility/strengthening, knee stability, hamstring strength, and saphenous nerve motility.  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 Treatment performed today focused on pt education detailed above. Pt demonstrated good understanding of education provided. required moderate v/t cues and no assistance for appropriate performance with today's activities. F/u and add to HEP next session, was limited at eval d/t time constraints. Pt requires the intervention of skilled outpatient physical therapy to address the aforementioned deficits and progress towards a functional level in line with therapeutic goals.   OBJECTIVE IMPAIRMENTS: decreased activity tolerance, decreased mobility, difficulty walking, decreased ROM, decreased strength, impaired perceived functional ability, impaired sensation, impaired tone, improper body mechanics, and pain.   ACTIVITY LIMITATIONS: sitting, standing, squatting, sleeping, stairs, dressing, locomotion level,  and caring for others  PARTICIPATION LIMITATIONS: cleaning, laundry, interpersonal relationship, driving, shopping, community activity, and occupation  PERSONAL FACTORS: Behavior pattern, Fitness, Profession, and Time since onset of injury/illness/exacerbation are also affecting patient's functional outcome.   REHAB POTENTIAL: Fair hesitance with PT  CLINICAL DECISION MAKING: Evolving/moderate complexity  EVALUATION COMPLEXITY: Low   GOALS: Goals reviewed with patient? YES  SHORT TERM GOALS: Target date: 10/08/2023  Pt will be independent with administered HEP to demonstrate the competency necessary for long term managemnet of symptoms at home.  Baseline: Goal status: INITIAL LONG TERM GOALS: Target date: 10/29/2023  Pt. Will achieve a LEFS score of 40 as to demonstrate improvement in self-perceived functional ability with daily activities.  Baseline: 27/80 Goal status: INITIAL  2.  Pt will improve Global Hip/knee strength to a 4+/5 to demonstrate improvement in strength for quality of motion and activity performance. Baseline:  Goal status: INITIAL  3.  Pt will report the ability to stand for >/= 30 minutes as to demonstrate improved tolerance to standing for prolonged time and improved ability to participate in work tasks ans ADLs.  Baseline:  Goal status: INITIAL  4.  Pt will perform a squat to parallel with less than 4/10 pain in BLE as to demonstrate improved BLE strength, coordination, functional mobility, and activity tolerance necessary for safe return to gym activities. Baseline:  Goal status: INITIAL  --------------------------------------------------------------------------------------------- PLAN:  PT FREQUENCY: 1-2x/week  PT DURATION: 6 weeks  PLANNED INTERVENTIONS: 97110-Therapeutic exercises, 97530- Therapeutic activity, 97112- Neuromuscular re-education, 97535- Self Care, [id number]- Manual therapy, [phone]- Gait training, [phone]- Aquatic Therapy, Patient/Family  education, Balance training, Stair training, Taping, Joint mobilization, DME instructions, and Moist heat  PLAN FOR NEXT SESSION: Review HEP, Begin POC as detailed in the assessment   Corean Pouch PTA  10/03/2023, 4:17 PM

## 2023-10-10 ENCOUNTER — Ambulatory Visit

## 2023-10-10 NOTE — Therapy (Incomplete)
 OUTPATIENT PHYSICAL THERAPY TREATMENT NOTE   Patient Name: Beth Velez MRN: [id number] DOB:12-30-1962, 61 y.o., female Today's Date: 10/10/2023  END OF SESSION:    Past Medical History:  Diagnosis Date   Diverticulitis    Past Surgical History:  Procedure Laterality Date   ABDOMINAL HYSTERECTOMY     APPENDECTOMY     BACK SURGERY     TOE SURGERY Left    great toe   Patient Active Problem List   Diagnosis Date Noted   Hyperlipidemia 04/07/2021   Stress at home 04/07/2021   TIA (transient ischemic attack) 04/06/2021   DEPRESSION 03/18/2007   Diverticulosis of colon 03/18/2007   Headache 03/18/2007   FOOT PAIN, LEFT 02/18/2007   COUGH 02/13/2007   Abdominal pain 10/26/2006   Essential hypertension 10/17/2006   BRONCHITIS NOS 07/10/2006    PCP: Douglass Gerard DEL, Pa-C  REFERRING PROVIDER: Inocencio Agent, MD  REFERRING DIAG: M22.2X1,M22.2X2 (ICD-10-CM) - Patellofemoral pain syndrome of both knees   Rationale for Evaluation and Treatment: Rehabilitation  THERAPY DIAG:  No diagnosis found.  PERTINENT HISTORY: HTN, TIA,   WEIGHT BEARING RESTRICTIONS: No  FALLS:  Has patient fallen in last 6 months? 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   PRECAUTIONS: None ---------------------------------------------------------------------------------------------  SUBJECTIVE:   SUBJECTIVE STATEMENT: ***  Patient reports that she fell onto her right side last week, onto her knee and shoulder. She cancelled her appointment last week due to this. She reports increased pain in her right knee, shoulder, and back.  Eval statement 09/17/2023: has been having B knee pain for 17h months. Had back surgery in 2020. B Knee pain started one month after packing up her house. Pain  is similar to pins/needles making foot  and anterior R knee numb. L side is 2/10, R knee keeps getting more painful, 10/10 today  RED FLAGS: None   PLOF: Independent  PATIENT GOALS: get my life back  NEXT MD VISIT: schedule as needed. ---------------------------------------------------------------------------------------------  OBJECTIVE:  Note: Objective measures were completed at Evaluation unless otherwise noted.  DIAGNOSTIC FINDINGS: MRI from pt phone, unremarkable results aside from some R knee meniscal fraying  PATIENT SURVEYS:  LEFS: 27/80  COGNITION: Overall cognitive status: Within functional limits for tasks assessed     SENSATION: Light touch: Impaired  anterior R knee, global R foot  EDEMA:  Minimal around R lateral knee  MUSCLE LENGTH: Hamstrings: Right 0 deg; Left 0 deg Thomas test: B tightness of 2/1 joint mms.  POSTURE: No Significant postural limitations  PALPATION: Tenderness to Adductor magnus/gracilis/sartorius with similar symptom reproduction.  LOWER EXTREMITY ROM:  Active ROM Right eval Left eval  Hip flexion    Hip extension    Hip abduction    Hip adduction    Hip internal rotation    Hip external rotation    Knee flexion    Knee extension    Ankle dorsiflexion    Ankle plantarflexion    Ankle inversion    Ankle eversion     (Blank rows = not tested)  ! Indicates pain with testing  LOWER EXTREMITY MMT:  MMT Right eval Left eval  Hip flexion 4- 4-  Hip extension    Hip abduction    Hip adduction 4 4  Hip internal rotation    Hip external rotation  Knee flexion 3+ 4-  Knee extension 4+ 4+  Ankle dorsiflexion    Ankle plantarflexion    Ankle inversion    Ankle eversion     (Blank rows = not tested)  ! Indicates pain with testing  LOWER EXTREMITY SPECIAL TESTS:  Knee special tests: Apley's compression test: positive , Apley's distraction test: positive , Patellafemoral apprehension test: negative, and Patella  tap test (ballotable patella): negative  FUNCTIONAL TESTS:    GAIT: Distance walked: 169ft Assistive device utilized: None Level of assistance: Complete Independence Comments: no AD, WFL  [facility] Adult PT Treatment:                                                DATE: 10/10/23 Therapeutic Exercise: Nustep level 3 x 8 mins while gathering subjective and planning session with patient Seated hamstring stretch x30 BIL Seated hip adduction 5 hold 2x10 Supine butterfly stretch x1 4# on knees (pain in right knee) Supine SLR with ER 2x10 BIL   [facility] Adult PT Treatment:                                                DATE: 10/03/23 Therapeutic Exercise: Nustep level 3 x 8 mins while gathering subjective and planning session with patient Seated hamstring stretch x30 BIL Seated hip adduction 5 hold 2x10 Supine butterfly stretch x1 4# on knees (pain in right knee) Supine SLR with ER 2x10 BIL                                                                                                                               [facility] Adult PT Treatment:                                                DATE: 09/17/2023 Self Care: Pt education, detailed below POC discussion   PATIENT EDUCATION:  Education details: Pt received education regarding HEP performance, ADL performance, functional activity tolerance, impairment education, appropriate performance of therapeutic activities. Person educated: Patient Education method: Explanation, Demonstration, Tactile cues, Verbal cues, and Handouts Education comprehension: verbalized understanding and returned demonstration  HOME EXERCISE PROGRAM: Standing adductor stretch 1-3 times daily, 10s hold 2x5 B, add to this next session. ---------------------------------------------------------------------------------------------  ASSESSMENT:  CLINICAL IMPRESSION: ***  Patient presents to first follow up PT session reporting that she fell last week onto her right  side and has had increased pain in her knee since then. She is very frustrated with the orthopedic doctors she has seen thus far due to lack of communication. Session  today focused on strengthening and stretching, mostly for RLE. Patient continues to benefit from skilled PT services and should be progressed as able to improve functional independence.   Eval impression (09/17/2023): Pt. attended today's physical therapy session for evaluation of B knee pain. Pt has complaints of B knee pain onset 17 months ago following moving from a house to an apartment. Pt gives reports of anterior knee pins and needles with occasional sharp pain into the foot. Pt has notable deficits and would benefit from therapeutic focus on Adductor motility/strengthening, knee stability, hamstring strength, and saphenous nerve motility.  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 Treatment performed today focused on pt education detailed above. Pt demonstrated good understanding of education provided. required moderate v/t cues and no assistance for appropriate performance with today's activities. F/u and add to HEP next session, was limited at eval d/t time constraints. Pt requires the intervention of skilled outpatient physical therapy to address the aforementioned deficits and progress towards a functional level in line with therapeutic goals.   OBJECTIVE IMPAIRMENTS: decreased activity tolerance, decreased mobility, difficulty walking, decreased ROM, decreased strength, impaired perceived functional ability, impaired sensation, impaired tone, improper body mechanics, and pain.   ACTIVITY LIMITATIONS: sitting, standing, squatting, sleeping, stairs, dressing, locomotion level, and caring for others  PARTICIPATION LIMITATIONS: cleaning, laundry, interpersonal relationship, driving, shopping, community activity, and occupation  PERSONAL FACTORS: Behavior pattern, Fitness, Profession, and Time since onset of  injury/illness/exacerbation are also affecting patient's functional outcome.   REHAB POTENTIAL: Fair hesitance with PT  CLINICAL DECISION MAKING: Evolving/moderate complexity  EVALUATION COMPLEXITY: Low   GOALS: Goals reviewed with patient? YES  SHORT TERM GOALS: Target date: 10/08/2023  Pt will be independent with administered HEP to demonstrate the competency necessary for long term managemnet of symptoms at home.  Baseline: Goal status: INITIAL LONG TERM GOALS: Target date: 10/29/2023  Pt. Will achieve a LEFS score of 40 as to demonstrate improvement in self-perceived functional ability with daily activities.  Baseline: 27/80 Goal status: INITIAL  2.  Pt will improve Global Hip/knee strength to a 4+/5 to demonstrate improvement in strength for quality of motion and activity performance. Baseline:  Goal status: INITIAL  3.  Pt will report the ability to stand for >/= 30 minutes as to demonstrate improved tolerance to standing for prolonged time and improved ability to participate in work tasks ans ADLs.  Baseline:  Goal status: INITIAL  4.  Pt will perform a squat to parallel with less than 4/10 pain in BLE as to demonstrate improved BLE strength, coordination, functional mobility, and activity tolerance necessary for safe return to gym activities. Baseline:  Goal status: INITIAL  --------------------------------------------------------------------------------------------- PLAN:  PT FREQUENCY: 1-2x/week  PT DURATION: 6 weeks  PLANNED INTERVENTIONS: 97110-Therapeutic exercises, 97530- Therapeutic activity, 97112- Neuromuscular re-education, 97535- Self Care, 02859- Manual therapy, [phone]- Gait training, [phone]- Aquatic Therapy, Patient/Family education, Balance training, Stair training, Taping, Joint mobilization, DME instructions, and Moist heat  PLAN FOR NEXT SESSION: Review HEP, Begin POC as detailed in the assessment   Corean Pouch PTA  10/10/2023, 8:56 AM

## 2023-10-17 ENCOUNTER — Ambulatory Visit: Admitting: Physical Therapy

## 2023-10-29 ENCOUNTER — Encounter: Admitting: Internal Medicine

## 2023-11-07 ENCOUNTER — Ambulatory Visit (HOSPITAL_BASED_OUTPATIENT_CLINIC_OR_DEPARTMENT_OTHER): Admitting: Orthopaedic Surgery

## 2023-11-09 ENCOUNTER — Ambulatory Visit

## 2023-11-12 ENCOUNTER — Encounter: Payer: Self-pay | Admitting: Internal Medicine

## 2023-11-19 ENCOUNTER — Encounter: Admitting: Internal Medicine

## 2023-11-26 ENCOUNTER — Encounter: Payer: Self-pay | Admitting: Radiology

## 2023-12-12 ENCOUNTER — Ambulatory Visit (INDEPENDENT_AMBULATORY_CARE_PROVIDER_SITE_OTHER): Admitting: Orthopaedic Surgery

## 2023-12-12 DIAGNOSIS — M222X2 Patellofemoral disorders, left knee: Secondary | ICD-10-CM

## 2023-12-12 DIAGNOSIS — M222X1 Patellofemoral disorders, right knee: Secondary | ICD-10-CM | POA: Diagnosis not present

## 2023-12-12 NOTE — Progress Notes (Signed)
 Chief Complaint: Bilateral knee pain     History of Present Illness:    Beth Velez is a 61 y.o. female presents with ongoing bilateral knee pain.  She is here today for third opinion.  She was initially seen at [facility].  She has been undergoing physical therapy.  She has had multiple steroid injections and then most recently hyaluronic acid in the left knee as well.  This seems to have only worsened things.  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  She has been having the pain for several years now.  This has been really limiting her ability to stay active.  These do swell equally and cause nighttime awakenings    PMH/PSH/Family History/Social History/Meds/Allergies:    Past Medical History:  Diagnosis Date   Diverticulitis    Past Surgical History:  Procedure Laterality Date   ABDOMINAL HYSTERECTOMY     APPENDECTOMY     BACK SURGERY     TOE SURGERY Left    great toe   Social History   Socioeconomic History   Marital status: Married    Spouse name: Not on file   Number of children: Not on file   Years of education: Not on file   Highest education level: Not on file  Occupational History   Not on file  Tobacco Use   Smoking status: Never    Passive exposure: Never   Smokeless tobacco: Never  Vaping Use   Vaping status: Never Used  Substance and Sexual Activity   Alcohol use: No   Drug use: No   Sexual activity: Not on file  Other Topics Concern   Not on file  Social History Narrative   Not on file   Social Drivers of Health   Financial Resource Strain: High Risk (11/14/2023)   Received from [facility]   Overall Financial Resource Strain (CARDIA)    How hard is it for you to pay for the very basics like food, housing, medical care, and heating?: Very hard  Food Insecurity: No Food Insecurity (11/14/2023)   Received from [facility]   Hunger Vital Sign    Within the past 12 months, you worried that your food would run out  before you got the money to buy more.: Never true    Within the past 12 months, the food you bought just didn't last and you didn't have money to get more.: Never true  Transportation Needs: No Transportation Needs (11/14/2023)   Received from [facility] - Transportation    In the past 12 months, has lack of transportation kept you from medical appointments or from getting medications?: No    In the past 12 months, has lack of transportation kept you from meetings, work, or from getting things needed for daily living?: No  Physical Activity: Inactive (11/14/2023)   Received from [facility]   Exercise Vital Sign    On average, how many days per week do you engage in moderate to strenuous exercise (like a brisk walk)?: 0 days    Minutes of Exercise per Session: Not on file  Stress: Stress Concern Present (11/14/2023)   Received from [facility] - Occupational Stress Questionnaire    Do you feel stress - tense, restless, nervous, or anxious, or unable to sleep at night because your mind is troubled all the time - these days?: Very much  Social Connections: Socially Isolated (11/14/2023)  Received from [employer]   Social Network    How would you rate your social network (family, work, friends)?: Little participation, lonely and socially isolated   Family History  Problem Relation Age of Onset   High Cholesterol Mother    Obesity Mother    Hydrocephalus Mother        normal pressure   Heart attack Father        slient   Dementia Father    High Cholesterol Brother    Healthy Brother    Autism Daughter        high functioning   Depression Daughter    Anxiety disorder Daughter    Autism Daughter        high functioning   Anxiety disorder Daughter    Depression Daughter    Tourette syndrome Daughter    Epilepsy Daughter    Rheum arthritis Cousin    Allergies  Allergen Reactions   Atorvastatin  Other (See Comments)    Current Outpatient Medications  Medication Sig Dispense Refill   clonazePAM  (KLONOPIN ) 1 MG tablet Take 1 mg by mouth 3 (three) times daily as needed.     desonide (DESOWEN) 0.05 % cream Apply topically 2 (two) times daily.     diclofenac Sodium (VOLTAREN) 1 % GEL 2 grams two to four times daily     famotidine  (PEPCID ) 40 MG tablet Take 1 tablet (40 mg total) by mouth at bedtime. 90 tablet 3   lamoTRIgine  (LAMICTAL ) 200 MG tablet Take 200 mg by mouth at bedtime.     meclizine (ANTIVERT) 25 MG tablet Take 25 mg by mouth as needed.     Na Sulfate-K Sulfate-Mg Sulfate concentrate (SUPREP) 17.5-3.13-1.6 GM/177ML SOLN Use as directed; may use generic; goodrx card if insurance will not cover generic 354 mL 0   pantoprazole  (PROTONIX ) 40 MG tablet Take 40 mg by mouth 2 (two) times daily.     rosuvastatin (CRESTOR) 5 MG tablet Take 1 tablet by mouth daily.     traZODone (DESYREL) 100 MG tablet Take by mouth.     No current facility-administered medications for this visit.   No results found.  Review of Systems:   A ROS was performed including pertinent positives and negatives as documented in the HPI.  Physical Exam :   Constitutional: NAD and appears stated age Neurological: Alert and oriented Psych: Appropriate affect and cooperative There were no vitals taken for this visit.   Comprehensive Musculoskeletal Exam:    Knees with bilateral effusion.  Range of motion is from -3 to 125 degrees without crepitus.  Positive patellar grind.  Negative McMurray no laxity of the patellas bilaterally   Imaging:   Xray (4 views right knee, 4 views left knee): Normal     I personally reviewed and interpreted the radiographs.   Assessment and Plan:   61 y.o. 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  At this time I would like her to bring back her MRIs so that I  can personally review these and I will plan to call her following   I personally saw and evaluated the patient, and participated in the management and treatment plan.  Elspeth Parker, MD Attending Physician, Orthopedic Surgery  This document was dictated using Dragon voice recognition software. A reasonable attempt at proof reading has been made to minimize errors.

## 2023-12-17 ENCOUNTER — Encounter: Payer: Self-pay | Admitting: Rheumatology

## 2023-12-24 ENCOUNTER — Encounter: Admitting: Internal Medicine

## 2023-12-24 ENCOUNTER — Telehealth (HOSPITAL_BASED_OUTPATIENT_CLINIC_OR_DEPARTMENT_OTHER): Payer: Self-pay | Admitting: Orthopaedic Surgery

## 2023-12-24 ENCOUNTER — Ambulatory Visit

## 2023-12-24 NOTE — Progress Notes (Deleted)
   Subjective:  Patient ID: Beth Velez, adult    DOB: 04-17-62, 61 y.o.   MRN: [id number]  CC: New Patient  HPI:  Beth Velez is a very pleasant 61 y.o. adult who presents today to establish care.   PMHx: Past Medical History:  Diagnosis Date   Diverticulitis     Surgical Hx: Past Surgical History:  Procedure Laterality Date   ABDOMINAL HYSTERECTOMY     APPENDECTOMY     BACK SURGERY     TOE SURGERY Left    great toe    Family Hx: Family History  Problem Relation Age of Onset   High Cholesterol Mother    Obesity Mother    Hydrocephalus Mother        normal pressure   Heart attack Father        slient   Dementia Father    Rheum arthritis Cousin     Social Hx: Current Social History   Who lives at home: *** Who would speak for you about health care matters: *** Transportation: *** Important Relationships & Pets: *** Marital status: Occupation: Children: Grandchildren: Current Stressors: ***  Work / Education:  *** Religious / Personal Beliefs: *** Interests / Fun: ***   Medications:    Preventative Screening Colonoscopy: date*** - results *** Mammogram: date*** - results *** Pap test: date*** - results *** PSA: date*** - results *** DEXA: date*** - results *** Tetanus vaccine: date*** - results *** Pneumonia vaccine: date*** - results *** Shingles vaccine: date*** - results *** Heart stress test: date*** - results *** Echocardiogram: date*** - results *** Xrays: date*** - results *** CT/MRI: date*** - results ***  Smoking status reviewed  ROS: pertinent noted in the HPI    Objective:  There were no vitals taken for this visit. Vitals and nursing note reviewed  General: Awake and Alert in NAD HEENT: NCAT. Sclera anicteric. No rhinorrhea. Cardiovascular: RRR. No M/R/G Respiratory: CTAB, normal WOB on RA. No wheezing, crackles, rhonchi, or diminished breath sounds. Abdomen: Soft, non-tender, non-distended. Bowel sounds  normoactive/hypoactive/hyperactive. *** Extremities: Able to move all extremities. No BLE edema, no deformities or significant joint findings. Skin: Warm and dry. No abrasions or rashes noted. Neuro: A&Ox***. No focal neurological deficits.  Assessment & Plan:   Assessment & Plan   Beth Melena, DO 12/24/2023, 11:38 PM

## 2023-12-24 NOTE — Telephone Encounter (Signed)
 Patient has dropped off MRI and wants to know if she should make an appointment please advise [phone]

## 2023-12-25 ENCOUNTER — Ambulatory Visit: Admitting: Family Medicine

## 2023-12-27 ENCOUNTER — Ambulatory Visit (HOSPITAL_BASED_OUTPATIENT_CLINIC_OR_DEPARTMENT_OTHER): Admitting: Student

## 2023-12-27 ENCOUNTER — Other Ambulatory Visit (HOSPITAL_BASED_OUTPATIENT_CLINIC_OR_DEPARTMENT_OTHER): Payer: Self-pay

## 2023-12-27 DIAGNOSIS — M25561 Pain in right knee: Secondary | ICD-10-CM

## 2023-12-27 DIAGNOSIS — M222X1 Patellofemoral disorders, right knee: Secondary | ICD-10-CM | POA: Diagnosis not present

## 2023-12-27 DIAGNOSIS — M222X2 Patellofemoral disorders, left knee: Secondary | ICD-10-CM | POA: Diagnosis not present

## 2023-12-27 DIAGNOSIS — M25562 Pain in left knee: Secondary | ICD-10-CM | POA: Diagnosis not present

## 2023-12-27 DIAGNOSIS — G8929 Other chronic pain: Secondary | ICD-10-CM

## 2023-12-27 MED ORDER — METHYLPREDNISOLONE 4 MG PO TBPK
ORAL_TABLET | ORAL | 0 refills | Status: DC
  Filled 2023-12-27: qty 21, 6d supply, fill #0

## 2023-12-27 NOTE — Progress Notes (Unsigned)
     HPI: Patient presents today for follow-up evaluation of bilateral knees.  She continues to experience often severe knee pain, right worse than left.  States that the knees do often swell and feels like there are shards of glass in her knees.  She has historically gotten some temporary relief with oral prednisone.  She was seen by Dr. Genelle on 11/19, and was recommended for potential aspiration and synovial fluid analysis today.   Physical Exam: Exam of bilateral knees demonstrates active range of motion from 0 to 130 degrees.  Mild skin irritation over the anterior right knee, however no erythema or warmth is present bilaterally.  No significant effusions present on exam or under ultrasound visualization.  No patellar laxity bilaterally.   Procedure Note  Patient: Beth Velez             Date of Birth: 05/20/62           MRN: [id number]             Visit Date: 12/27/2023  Procedures: Visit Diagnoses:  1. Patellofemoral pain syndrome of both knees     Large Joint Inj: R knee on 12/27/2023 6:05 PM Indications: pain Details: 18 G 1.5 in needle, superolateral approach Medications: 5 mL lidocaine  1 % Aspirate: 0 mL Outcome: tolerated well, no immediate complications Procedure, treatment alternatives, risks and benefits explained, specific risks discussed. Consent was given by the patient. Immediately prior to procedure a time out was called to verify the correct patient, procedure, equipment, support staff and site/side marked as required. Patient was prepped and draped in the usual sterile fashion.      Plan: Aspiration of the right knee was unfortunately unsuccessful after multiple attempts made under ultrasound guidance therefore synovial fluid analysis cannot be completed.  No significant fluid visualized in the left knee.  Patient will follow back up with Dr. Genelle to discuss further treatment options.    I personally saw and evaluated the patient, and participated in  the management and treatment plan.  Leonce Reveal, PA-C Orthopedics

## 2023-12-28 MED ORDER — LIDOCAINE HCL 1 % IJ SOLN
5.0000 mL | INTRAMUSCULAR | Status: AC | PRN
  Administered 2023-12-27: 5 mL

## 2024-01-02 ENCOUNTER — Ambulatory Visit: Admitting: Urology

## 2024-01-03 ENCOUNTER — Ambulatory Visit (HOSPITAL_BASED_OUTPATIENT_CLINIC_OR_DEPARTMENT_OTHER): Admitting: Orthopaedic Surgery

## 2024-01-03 ENCOUNTER — Ambulatory Visit (HOSPITAL_BASED_OUTPATIENT_CLINIC_OR_DEPARTMENT_OTHER): Payer: Self-pay | Admitting: Orthopaedic Surgery

## 2024-01-03 ENCOUNTER — Other Ambulatory Visit (HOSPITAL_BASED_OUTPATIENT_CLINIC_OR_DEPARTMENT_OTHER): Payer: Self-pay

## 2024-01-03 ENCOUNTER — Encounter (HOSPITAL_BASED_OUTPATIENT_CLINIC_OR_DEPARTMENT_OTHER): Payer: Self-pay | Admitting: Orthopaedic Surgery

## 2024-01-03 DIAGNOSIS — M25561 Pain in right knee: Secondary | ICD-10-CM

## 2024-01-03 DIAGNOSIS — G8929 Other chronic pain: Secondary | ICD-10-CM

## 2024-01-03 DIAGNOSIS — M222X2 Patellofemoral disorders, left knee: Secondary | ICD-10-CM | POA: Diagnosis not present

## 2024-01-03 DIAGNOSIS — M25562 Pain in left knee: Secondary | ICD-10-CM | POA: Diagnosis not present

## 2024-01-03 DIAGNOSIS — M222X1 Patellofemoral disorders, right knee: Secondary | ICD-10-CM

## 2024-01-03 MED ORDER — TRIAMCINOLONE ACETONIDE 40 MG/ML IJ SUSP
80.0000 mg | INTRAMUSCULAR | Status: AC | PRN
  Administered 2024-01-03: 80 mg via INTRA_ARTICULAR

## 2024-01-03 MED ORDER — LIDOCAINE HCL 1 % IJ SOLN
4.0000 mL | INTRAMUSCULAR | Status: AC | PRN
  Administered 2024-01-03: 4 mL

## 2024-01-03 NOTE — Telephone Encounter (Signed)
This has been taken care.

## 2024-01-03 NOTE — Progress Notes (Signed)
 Chief Complaint: Bilateral knee pain     History of Present Illness:   01/03/2024: Presents today for follow-up of bilateral knees.  She is status post right knee aspiration without positive fluid.  Beth Velez is a 61 y.o. adult presents with ongoing bilateral knee pain.  She is here today for third opinion.  She was initially seen at [facility].  She has been undergoing physical therapy.  She has had multiple steroid injections and then most recently hyaluronic acid in the left knee as well.  This seems to have only worsened things.  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  She has been having the pain for several years now.  This has been really limiting her ability to stay active.  These do swell equally and cause nighttime awakenings    PMH/PSH/Family History/Social History/Meds/Allergies:    Past Medical History:  Diagnosis Date   Diverticulitis    Past Surgical History:  Procedure Laterality Date   ABDOMINAL HYSTERECTOMY     APPENDECTOMY     BACK SURGERY     TOE SURGERY Left    great toe   Social History   Socioeconomic History   Marital status: Unknown    Spouse name: Not on file   Number of children: Not on file   Years of education: Not on file   Highest education level: Master's degree (e.g., MA, MS, MEng, MEd, MSW, MBA)  Occupational History   Not on file  Tobacco Use   Smoking status: Never    Passive exposure: Never   Smokeless tobacco: Never  Vaping Use   Vaping status: Never Used  Substance and Sexual Activity   Alcohol use: No   Drug use: No   Sexual activity: Not on file  Other Topics Concern   Not on file  Social History Narrative   Not on file   Social Drivers of Health   Tobacco Use: Low Risk (12/08/2023)   Received from [facility]   Patient History    Smoking Tobacco Use: Never    Smokeless Tobacco Use: Never    Passive Exposure: Not on file  Financial Resource Strain: Patient Declined  (12/24/2023)   Overall Financial Resource Strain (CARDIA)    Difficulty of Paying Living Expenses: Patient declined  Recent Concern: Financial Resource Strain - High Risk (11/14/2023)   Received from [facility]   Overall Financial Resource Strain (CARDIA)    How hard is it for you to pay for the very basics like food, housing, medical care, and heating?: Very hard  Food Insecurity: Patient Declined (12/24/2023)   Epic    Worried About Programme Researcher, Broadcasting/film/video in the Last Year: Patient declined    Barista in the Last Year: Patient declined  Transportation Needs: No Transportation Needs (12/24/2023)   Epic    Lack of Transportation (Medical): No    Lack of Transportation (Non-Medical): No  Physical Activity: Inactive (12/24/2023)   Exercise Vital Sign    Days of Exercise per Week: 0 days    Minutes of Exercise per Session: Not on file  Stress: Stress Concern Present (12/24/2023)   [employer] [facility] - Occupational Stress Questionnaire    Feeling of Stress: Very much  Social Connections: Unknown (12/24/2023)   Social Connection and Isolation Panel    Frequency of Communication with Friends and Family: Never    Frequency of Social Gatherings with Friends and Family: Never    Attends  Religious Services: Never    Active Member of Clubs or Organizations: No    Attends Banker Meetings: Not on file    Marital Status: Patient declined  Recent Concern: Social Connections - Socially Isolated (11/14/2023)   Received from [facility]   Social Network    How would you rate your social network (family, work, friends)?: Little participation, lonely and socially isolated  Depression (PHQ2-9): Not on file  Alcohol Screen: Not on file  Housing: Low Risk (12/24/2023)   Epic    Unable to Pay for Housing in the Last Year: No    Number of Times Moved in the Last Year: 0    Homeless in the Last Year: No  Utilities: Not At Risk (11/14/2023)    Received from [facility]    In the past 12 months has the electric, gas, oil, or water company threatened to shut off services in your home?: No  Health Literacy: Not on file   Family History  Problem Relation Age of Onset   High Cholesterol Mother    Obesity Mother    Hydrocephalus Mother        normal pressure   Heart attack Father        slient   Dementia Father    Rheum arthritis Cousin    Allergies  Allergen Reactions   Atorvastatin  Other (See Comments)   Current Outpatient Medications  Medication Sig Dispense Refill   clonazePAM  (KLONOPIN ) 1 MG tablet Take 1 mg by mouth 3 (three) times daily as needed.     desonide (DESOWEN) 0.05 % cream Apply topically 2 (two) times daily.     diclofenac Sodium (VOLTAREN) 1 % GEL 2 grams two to four times daily     famotidine  (PEPCID ) 40 MG tablet Take 1 tablet (40 mg total) by mouth at bedtime. 90 tablet 3   lamoTRIgine  (LAMICTAL ) 200 MG tablet Take 200 mg by mouth at bedtime.     meclizine (ANTIVERT) 25 MG tablet Take 25 mg by mouth as needed.     methylPREDNISolone  (MEDROL  DOSEPAK) 4 MG TBPK tablet Take per packet instructions 21 tablet 0   Na Sulfate-K Sulfate-Mg Sulfate concentrate (SUPREP) 17.5-3.13-1.6 GM/177ML SOLN Use as directed; may use generic; goodrx card if insurance will not cover generic 354 mL 0   pantoprazole  (PROTONIX ) 40 MG tablet Take 40 mg by mouth 2 (two) times daily.     rosuvastatin (CRESTOR) 5 MG tablet Take 1 tablet by mouth daily.     traZODone (DESYREL) 100 MG tablet Take by mouth.     No current facility-administered medications for this visit.   No results found.  Review of Systems:   A ROS was performed including pertinent positives and negatives as documented in the HPI.  Physical Exam :   Constitutional: NAD and appears stated age Neurological: Alert and oriented Psych: Appropriate affect and cooperative There were no vitals taken for this visit.   Comprehensive  Musculoskeletal Exam:    Knees with bilateral effusion.  Range of motion is from -3 to 125 degrees without crepitus.  Positive patellar grind.  Negative McMurray no laxity of the patellas bilaterally   Imaging:   Xray (4 views right knee, 4 views left knee): Normal  MRI bilateral knees: Negative   I personally reviewed and interpreted the radiographs.   Assessment and Plan:   61 y.o. adult with evidence of inflammatory versus crystalline disease of the knees.  At this time we have not  been able to aspirate any fluid from the knees.  As a result I did discuss that I would recommend ultimately a diagnostic arthroscopy with synovial biopsy as well as fluid collections that we can send this to the lab for processing and for further diagnosis.  We will also plan to send her to rheumatology.  Pending this diagnostic arthroscopy  She is also electing for bilateral knee injections today superficial    Procedure Note  Patient: Beth Velez             Date of Birth: 1962/04/22           MRN: [id number]             Visit Date: 01/03/2024  Procedures: Visit Diagnoses:  1. Patellofemoral pain syndrome of both knees   2. Chronic pain of both knees     Large Joint Inj: R knee on 01/03/2024 1:08 PM Indications: pain Details: 22 G 1.5 in needle, ultrasound-guided anterior approach  Arthrogram: No  Medications: 4 mL lidocaine  1 %; 80 mg triamcinolone acetonide 40 MG/ML Outcome: tolerated well, no immediate complications Procedure, treatment alternatives, risks and benefits explained, specific risks discussed. Consent was given by the patient. Immediately prior to procedure a time out was called to verify the correct patient, procedure, equipment, support staff and site/side marked as required. Patient was prepped and draped in the usual sterile fashion.    Large Joint Inj: L knee on 01/03/2024 1:08 PM Indications: pain Details: 22 G 1.5 in needle, ultrasound-guided anterior  approach  Arthrogram: No  Medications: 4 mL lidocaine  1 %; 80 mg triamcinolone acetonide 40 MG/ML Outcome: tolerated well, no immediate complications Procedure, treatment alternatives, risks and benefits explained, specific risks discussed. Consent was given by the patient. Immediately prior to procedure a time out was called to verify the correct patient, procedure, equipment, support staff and site/side marked as required. Patient was prepped and draped in the usual sterile fashion.         I personally saw and evaluated the patient, and participated in the management and treatment plan.  Elspeth Parker, MD Attending Physician, Orthopedic Surgery  This document was dictated using Dragon voice recognition software. A reasonable attempt at proof reading has been made to minimize errors.

## 2024-01-04 ENCOUNTER — Ambulatory Visit: Admitting: Family Medicine

## 2024-01-04 NOTE — Progress Notes (Deleted)
° °  Subjective:  Patient ID: Beth Velez, adult    DOB: 13-Oct-1962, 61 y.o.   MRN: [id number]  CC: New Patient  HPI:  Beth Velez is a very pleasant 61 y.o. adult who presents today to establish care.   PMHx: Past Medical History:  Diagnosis Date   Diverticulitis     Surgical Hx: Past Surgical History:  Procedure Laterality Date   ABDOMINAL HYSTERECTOMY     APPENDECTOMY     BACK SURGERY     TOE SURGERY Left    great toe    Family Hx: Family History  Problem Relation Age of Onset   High Cholesterol Mother    Obesity Mother    Hydrocephalus Mother        normal pressure   Heart attack Father        slient   Dementia Father    Rheum arthritis Cousin     Social Hx: Current Social History   Who lives at home: *** Who would speak for you about health care matters: *** Transportation: *** Important Relationships & Pets: *** Marital status: Occupation: Children: Grandchildren: Current Stressors: ***  Work / Education:  *** Religious / Personal Beliefs: *** Interests / Fun: ***   Medications:    Preventative Screening Colonoscopy: date*** - results *** Mammogram: date*** - results *** Pap test: no longer requires d/t cervix removal with abdominal hysterectomy in 2012 Tetanus vaccine: date*** - results *** Pneumonia vaccine: date*** - results *** Shingles vaccine: date*** - results ***  Smoking status reviewed  ROS: pertinent noted in the HPI    Objective:  There were no vitals taken for this visit. Vitals and nursing note reviewed  General: Awake and Alert in NAD HEENT: NCAT. Sclera anicteric. No rhinorrhea. Cardiovascular: RRR. No M/R/G Respiratory: CTAB, normal WOB on RA. No wheezing, crackles, rhonchi, or diminished breath sounds. Abdomen: Soft, non-tender, non-distended. Bowel sounds normoactive/hypoactive/hyperactive. *** Extremities: Able to move all extremities. No BLE edema, no deformities or significant joint findings. Skin: Warm and  dry. No abrasions or rashes noted. Neuro: A&Ox***. No focal neurological deficits.  Assessment & Plan:   Assessment & Plan   [name], DO 01/04/2024, 9:27 AM

## 2024-01-07 ENCOUNTER — Encounter (HOSPITAL_BASED_OUTPATIENT_CLINIC_OR_DEPARTMENT_OTHER): Payer: Self-pay | Admitting: Orthopaedic Surgery

## 2024-01-08 ENCOUNTER — Other Ambulatory Visit (HOSPITAL_BASED_OUTPATIENT_CLINIC_OR_DEPARTMENT_OTHER): Payer: Self-pay | Admitting: Orthopaedic Surgery

## 2024-01-08 DIAGNOSIS — G8929 Other chronic pain: Secondary | ICD-10-CM

## 2024-01-08 NOTE — Telephone Encounter (Signed)
 Referral faxed to Christell Frater, MD Family MedicinePrimary Care[facility]heumatology at [phone]

## 2024-01-10 ENCOUNTER — Institutional Professional Consult (permissible substitution) (HOSPITAL_BASED_OUTPATIENT_CLINIC_OR_DEPARTMENT_OTHER): Admitting: Nurse Practitioner

## 2024-01-15 ENCOUNTER — Ambulatory Visit: Admitting: Gastroenterology

## 2024-01-15 ENCOUNTER — Telehealth: Payer: Self-pay | Admitting: Internal Medicine

## 2024-01-15 NOTE — Telephone Encounter (Signed)
 Need to have OV scheduled first

## 2024-01-15 NOTE — Telephone Encounter (Signed)
 Inbound call from patient stating they are needing to cancel appointment for today but would like to know if they can schedule a direct colonoscopy or do they need to be seen in office first  Please advise  Thank you

## 2024-01-15 NOTE — Progress Notes (Deleted)
 "  Chief Complaint: Primary GI MD:Dr. Albertus  HPI:  *** is a  ***  who was referred to me by Douglass Gerard DEL, PA-C for a complaint of *** .     Discussed the use of AI scribe software for clinical note transcription with the patient, who gave verbal consent to proceed.  History of Present Illness      PREVIOUS GI WORKUP     Past Medical History:  Diagnosis Date   Diverticulitis     Past Surgical History:  Procedure Laterality Date   ABDOMINAL HYSTERECTOMY     APPENDECTOMY     BACK SURGERY     TOE SURGERY Left    great toe    Current Outpatient Medications  Medication Sig Dispense Refill   clonazePAM  (KLONOPIN ) 1 MG tablet Take 1 mg by mouth 3 (three) times daily as needed.     desonide (DESOWEN) 0.05 % cream Apply topically 2 (two) times daily.     diclofenac Sodium (VOLTAREN) 1 % GEL 2 grams two to four times daily     famotidine  (PEPCID ) 40 MG tablet Take 1 tablet (40 mg total) by mouth at bedtime. 90 tablet 3   lamoTRIgine  (LAMICTAL ) 200 MG tablet Take 200 mg by mouth at bedtime.     meclizine (ANTIVERT) 25 MG tablet Take 25 mg by mouth as needed.     methylPREDNISolone  (MEDROL  DOSEPAK) 4 MG TBPK tablet Take per packet instructions 21 tablet 0   Na Sulfate-K Sulfate-Mg Sulfate concentrate (SUPREP) 17.5-3.13-1.6 GM/177ML SOLN Use as directed; may use generic; goodrx card if insurance will not cover generic 354 mL 0   pantoprazole  (PROTONIX ) 40 MG tablet Take 40 mg by mouth 2 (two) times daily.     rosuvastatin (CRESTOR) 5 MG tablet Take 1 tablet by mouth daily.     traZODone (DESYREL) 100 MG tablet Take by mouth.     No current facility-administered medications for this visit.    Allergies as of 01/15/2024 - Review Complete 12/27/2023  Allergen Reaction Noted   Atorvastatin  Other (See Comments) 06/15/2020    Family History  Problem Relation Age of Onset   High Cholesterol Mother    Obesity Mother    Hydrocephalus Mother        normal pressure   Heart  attack Father        slient   Dementia Father    Rheum arthritis Cousin     Social History   Socioeconomic History   Marital status: Unknown    Spouse name: Not on file   Number of children: Not on file   Years of education: Not on file   Highest education level: Master's degree (e.g., MA, MS, MEng, MEd, MSW, MBA)  Occupational History   Not on file  Tobacco Use   Smoking status: Never    Passive exposure: Never   Smokeless tobacco: Never  Vaping Use   Vaping status: Never Used  Substance and Sexual Activity   Alcohol use: No   Drug use: No   Sexual activity: Not on file  Other Topics Concern   Not on file  Social History Narrative   Not on file   Social Drivers of Health   Tobacco Use: Low Risk (12/08/2023)   Received from [facility]   Patient History    Smoking Tobacco Use: Never    Smokeless Tobacco Use: Never    Passive Exposure: Not on file  Financial Resource Strain: Patient Declined (01/03/2024)   Overall Financial  Resource Strain (CARDIA)    Difficulty of Paying Living Expenses: Patient declined  Recent Concern: Financial Resource Strain - High Risk (11/14/2023)   Received from [facility]   Overall Financial Resource Strain (CARDIA)    How hard is it for you to pay for the very basics like food, housing, medical care, and heating?: Very hard  Food Insecurity: Patient Declined (01/03/2024)   Epic    Worried About Programme Researcher, Broadcasting/film/video in the Last Year: Patient declined    Barista in the Last Year: Patient declined  Transportation Needs: No Transportation Needs (01/03/2024)   Epic    Lack of Transportation (Medical): No    Lack of Transportation (Non-Medical): No  Physical Activity: Inactive (01/03/2024)   Exercise Vital Sign    Days of Exercise per Week: 0 days    Minutes of Exercise per Session: Not on file  Stress: Stress Concern Present (01/03/2024)   [employer] of Occupational Health - Occupational Stress Questionnaire     Feeling of Stress: Very much  Social Connections: Unknown (01/03/2024)   Social Connection and Isolation Panel    Frequency of Communication with Friends and Family: Never    Frequency of Social Gatherings with Friends and Family: Never    Attends Religious Services: Never    Database Administrator or Organizations: No    Attends Engineer, Structural: Not on file    Marital Status: Patient declined  Recent Concern: Social Connections - Socially Isolated (11/14/2023)   Received from [employer]   Social Network    How would you rate your social network (family, work, friends)?: Little participation, lonely and socially isolated  Intimate Partner Violence: Not At Risk (11/14/2023)   Received from [facility]   HITS    Over the last 12 months how often did your partner physically hurt you?: Never    Over the last 12 months how often did your partner insult you or talk down to you?: Never    Over the last 12 months how often did your partner threaten you with physical harm?: Never    Over the last 12 months how often did your partner scream or curse at you?: Never  Depression (PHQ2-9): Not on file  Alcohol Screen: Not on file  Housing: Low Risk (01/03/2024)   Epic    Unable to Pay for Housing in the Last Year: No    Number of Times Moved in the Last Year: 0    Homeless in the Last Year: No  Utilities: Not At Risk (11/14/2023)   [facility] from [facility]    In the past 12 months has the electric, gas, oil, or water company threatened to shut off services in your home?: No  Health Literacy: Not on file    Review of Systems:    Constitutional: No weight loss, fever, chills, weakness or fatigue HEENT: Eyes: No change in vision               Ears, Nose, Throat:  No change in hearing or congestion Skin: No rash or itching Cardiovascular: No chest pain, chest pressure or palpitations   Respiratory: No SOB or cough Gastrointestinal: See HPI and otherwise  negative Genitourinary: No dysuria or change in urinary frequency Neurological: No headache, dizziness or syncope Musculoskeletal: No new muscle or joint pain Hematologic: No bleeding or bruising Psychiatric: No history of depression or anxiety    Physical Exam:  Vital signs: There were no  vitals taken for this visit.  Constitutional: NAD, alert and cooperative Head:  Normocephalic and atraumatic. Eyes:   PEERL, EOMI. No icterus. Conjunctiva pink. Respiratory: Respirations even and unlabored. Lungs clear to auscultation bilaterally.   No wheezes, crackles, or rhonchi.  Cardiovascular:  Regular rate and rhythm. No peripheral edema, cyanosis or pallor.  Gastrointestinal:  Soft, nondistended, nontender. No rebound or guarding. Normal bowel sounds. No appreciable masses or hepatomegaly. Rectal:  Declines Msk:  Symmetrical without gross deformities. Without edema, no deformity or joint abnormality.  Neurologic:  Alert and  oriented x4;  grossly normal neurologically.  Skin:   Dry and intact without significant lesions or rashes. Psychiatric: Oriented to person, place and time. Demonstrates good judgement and reason without abnormal affect or behaviors.  Physical Exam    RELEVANT LABS AND IMAGING: CBC    Component Value Date/Time   WBC 8.1 10/13/2022 1116   RBC 4.25 10/13/2022 1116   HGB 12.8 10/13/2022 1116   HCT 38.5 10/13/2022 1116   PLT 336 10/13/2022 1116   MCV 90.6 10/13/2022 1116   MCH 30.1 10/13/2022 1116   MCHC 33.2 10/13/2022 1116   RDW 13.1 10/13/2022 1116   LYMPHSABS 2,454 10/13/2022 1116   MONOABS 0.4 04/06/2021 1141   EOSABS 113 10/13/2022 1116   BASOSABS 57 10/13/2022 1116    CMP     Component Value Date/Time   NA 138 10/13/2022 1116   K 4.4 10/13/2022 1116   CL 101 10/13/2022 1116   CO2 29 10/13/2022 1116   GLUCOSE 86 10/13/2022 1116   GLUCOSE 119 (H) 11/17/2005 1448   BUN 11 10/13/2022 1116   CREATININE 0.97 10/13/2022 1116   CALCIUM  9.9 10/13/2022  1116   PROT 7.2 10/13/2022 1116   ALBUMIN 4.4 04/06/2021 1141   AST 17 10/13/2022 1116   ALT 20 10/13/2022 1116   ALKPHOS 52 04/06/2021 1141   BILITOT 0.6 10/13/2022 1116   GFRNONAA >60 04/06/2021 1755   GFRAA >60 07/14/2019 1033     Assessment/Plan:   Chronic GERD Over 20 years, not controlled on PPI. Did not schedule EGD at last appt. On pantoprazole  40mg  and famotidine  40mg   Chronic lifelong constipation On miralax  Colon cancer screening Did not schedule at last appt. Due for repeat. She reports having a normal colonoscopy over 10 years ago through [facility].  She denies family history of colon cancer or GI malignancies.    Nestor Blower, PA-C [facility] 01/15/2024, 10:18 AM  Cc: Douglass Gerard DEL, PA-C "

## 2024-01-21 NOTE — Progress Notes (Deleted)
" °  Cardiology Office Note:  .   Date:  01/21/2024 ID:  Beth Velez, DOB 12/06/1962, MRN [id number] PCP: Beth Velez [facility] Providers Cardiologist:  None { Click to update primary MD,subspecialty MD or APP then REFRESH:1}   Patient Profile: .      PMH Family history of heart disease Hyperlipidemia Hypertension Echocardiogram 04/07/2021 Normal LVEF 55 to 60% No RWMA Normal diastolic parameters Normal RV Mild MR    History of Present Illness: .   Beth Velez is a *** 61 y.o. adult  who is here today for new patient consult for ***  Lipid panel 11/14/2023 with total cholesterol 254, triglycerides 124, HDL 68, and LDL 164  Family history: Angel's family history includes Dementia in Kosta's father; Heart attack in Kaimana's father; High Cholesterol in Jonahtan's mother; Hydrocephalus in Navdeep's mother; Obesity in Nell's mother; Rheum arthritis in Deryk's cousin.   Discussed the use of AI scribe software for clinical note transcription with the patient, who gave verbal consent to proceed.  ASCVD Risk Score: The 10-year ASCVD risk score (Arnett DK, et al., 2019) is: 10.8%   Values used to calculate the score:     Age: 23 years     Clinically relevant sex: Could not be determined (highest resulting score shown)     Is Non-Hispanic African American: No     Diabetic: No     Tobacco smoker: No     Systolic Blood Pressure: 128 mmHg     Is BP treated: Yes     HDL Cholesterol: 68 mg/dL     Total Cholesterol: 254 mg/dL  {MD Calc ASCVD Calculator :1}  Diet:  Activity:   No results found for: LIPOA    ROS: ***       Studies Reviewed: .          *** Risk Assessment/Calculations:   {Does this patient have ATRIAL FIBRILLATION?[phone]} No BP recorded.  {Refresh Note OR Click here to enter BP  :1}***       Physical Exam:   VS: There were no vitals taken for this visit.  Wt Readings from Last 3 Encounters:  08/20/23 162 lb (73.5 kg)  10/13/22 165  lb 9.6 oz (75.1 kg)  04/06/21 170 lb (77.1 kg)     GEN: Well nourished, well developed in no acute distress NECK: No JVD; No carotid bruits CARDIAC: ***RRR, no murmurs, rubs, gallops RESPIRATORY:  Clear to auscultation without rales, wheezing or rhonchi  ABDOMEN: Soft, non-tender, non-distended EXTREMITIES:  No edema; No deformity     ASSESSMENT AND PLAN: .     Plan/Goals:{ Click here to update goals :1}        {Are you ordering a CV Procedure (e.g. stress test, cath, DCCV, TEE, etc)?   Press F2        :[id number]}  Dispo: ***  Signed, Rosaline Bane, NP-C "

## 2024-01-22 ENCOUNTER — Institutional Professional Consult (permissible substitution) (HOSPITAL_BASED_OUTPATIENT_CLINIC_OR_DEPARTMENT_OTHER): Admitting: Nurse Practitioner

## 2024-02-05 ENCOUNTER — Encounter (HOSPITAL_BASED_OUTPATIENT_CLINIC_OR_DEPARTMENT_OTHER): Payer: Self-pay | Admitting: Orthopaedic Surgery

## 2024-02-05 NOTE — Progress Notes (Unsigned)
" °  Cardiology Office Note:  .   Date:  02/05/2024 ID:  Donzell KANDICE Palms, DOB 1962-06-15, MRN [id number] PCP: Emilio Joesph VEAR DEVONNA [facility] Providers Cardiologist:  None { Click to update primary MD,subspecialty MD or APP then REFRESH:1}   Patient Profile: .      PMH Family history of heart disease Hyperlipidemia Hypertension Echocardiogram 04/07/2021 Normal LVEF 55 to 60% No RWMA Normal diastolic parameters Normal RV Mild MR    History of Present Illness: .   Beth Velez is a *** 62 y.o. adult  who is here today for new patient consult for ***  Lipid panel 11/14/2023 with total cholesterol 254, triglycerides 124, HDL 68, and LDL 164  Family history: Redding's family history includes Dementia in Micky's father; Heart attack in Issacc's father; High Cholesterol in Ramey's mother; Hydrocephalus in Rage's mother; Obesity in Vere's mother; Rheum arthritis in Nichols's cousin.   Discussed the use of AI scribe software for clinical note transcription with the patient, who gave verbal consent to proceed.  ASCVD Risk Score: The 10-year ASCVD risk score (Arnett DK, et al., 2019) is: 11.7%   Values used to calculate the score:     Age: 33 years     Clinically relevant sex: Could not be determined (highest resulting score shown)     Is Non-Hispanic African American: No     Diabetic: No     Tobacco smoker: No     Systolic Blood Pressure: 128 mmHg     Is BP treated: Yes     HDL Cholesterol: 68 mg/dL     Total Cholesterol: 254 mg/dL  {MD Calc ASCVD Calculator :1}  Diet:  Activity:   No results found for: LIPOA    ROS: ***       Studies Reviewed: .          *** Risk Assessment/Calculations:   {Does this patient have ATRIAL FIBRILLATION?:[phone]} No BP recorded.  {Refresh Note OR Click here to enter BP  :1}***       Physical Exam:   VS: There were no vitals taken for this visit.  Wt Readings from Last 3 Encounters:  08/20/23 162 lb (73.5 kg)  10/13/22 165  lb 9.6 oz (75.1 kg)  04/06/21 170 lb (77.1 kg)     GEN: Well nourished, well developed in no acute distress NECK: No JVD; No carotid bruits CARDIAC: ***RRR, no murmurs, rubs, gallops RESPIRATORY:  Clear to auscultation without rales, wheezing or rhonchi  ABDOMEN: Soft, non-tender, non-distended EXTREMITIES:  No edema; No deformity     ASSESSMENT AND PLAN: .     Plan/Goals:{ Click here to update goals :1}        {Are you ordering a CV Procedure (e.g. stress test, cath, DCCV, TEE, etc)?   Press F2        :[id number]}  Dispo: ***  Signed, Rosaline Bane, NP-C "

## 2024-02-05 NOTE — Telephone Encounter (Signed)
 Samule can you advise on what is going on with this referral?

## 2024-02-06 ENCOUNTER — Other Ambulatory Visit (HOSPITAL_BASED_OUTPATIENT_CLINIC_OR_DEPARTMENT_OTHER): Payer: Self-pay | Admitting: Orthopaedic Surgery

## 2024-02-06 ENCOUNTER — Ambulatory Visit: Payer: Medicare (Managed Care) | Admitting: Urology

## 2024-02-06 DIAGNOSIS — G8929 Other chronic pain: Secondary | ICD-10-CM

## 2024-02-11 ENCOUNTER — Institutional Professional Consult (permissible substitution) (HOSPITAL_BASED_OUTPATIENT_CLINIC_OR_DEPARTMENT_OTHER): Payer: Medicare (Managed Care) | Admitting: Nurse Practitioner

## 2024-02-19 ENCOUNTER — Encounter (HOSPITAL_BASED_OUTPATIENT_CLINIC_OR_DEPARTMENT_OTHER): Payer: Self-pay | Admitting: Orthopaedic Surgery

## 2024-02-21 ENCOUNTER — Ambulatory Visit: Payer: Medicare (Managed Care) | Admitting: Urology

## 2024-02-21 ENCOUNTER — Encounter: Payer: Self-pay | Admitting: Physical Medicine & Rehabilitation

## 2024-02-21 ENCOUNTER — Encounter: Payer: Self-pay | Admitting: Urology

## 2024-02-21 VITALS — BP 153/99 | HR 112 | Ht 64.0 in | Wt 175.0 lb

## 2024-02-21 DIAGNOSIS — R8290 Unspecified abnormal findings in urine: Secondary | ICD-10-CM

## 2024-02-21 DIAGNOSIS — N390 Urinary tract infection, site not specified: Secondary | ICD-10-CM | POA: Diagnosis not present

## 2024-02-21 DIAGNOSIS — Z87440 Personal history of urinary (tract) infections: Secondary | ICD-10-CM

## 2024-02-21 LAB — URINALYSIS, ROUTINE W REFLEX MICROSCOPIC
Bilirubin, UA: NEGATIVE
Glucose, UA: NEGATIVE
Ketones, UA: NEGATIVE
Nitrite, UA: NEGATIVE
Protein,UA: NEGATIVE
Urobilinogen, Ur: 0.2 mg/dL (ref 0.2–1.0)
pH, UA: 6 (ref 5.0–7.5)

## 2024-02-21 LAB — MICROSCOPIC EXAMINATION: WBC, UA: >30 /HPF — AB (ref 0–5)

## 2024-02-21 LAB — BLADDER SCAN AMB NON-IMAGING

## 2024-02-21 MED ORDER — NITROFURANTOIN MONOHYD MACRO 100 MG PO CAPS: 100.0000 mg | ORAL_CAPSULE | Freq: Two times a day (BID) | ORAL | 0 refills | Status: AC

## 2024-02-21 MED ORDER — ESTRADIOL 0.01 % VA CREA: TOPICAL_CREAM | VAGINAL | 11 refills | Status: AC

## 2024-02-21 NOTE — Progress Notes (Signed)
 " Assessment: 1. Frequent UTI   2. Abnormal urine findings     Plan: I personally reviewed the patient's chart including provider notes, lab results. Methods to reduce the risk of UTIs discussed with the patient including timed and double voiding, increase fluid intake, daily cranberry supplement, daily probiotic, and vaginal hormone replacement.  I also discussed the use of a daily prophylactic antibiotic in some cases. Urine culture sent today. Begin Macrobid  twice daily x 7 days for UTI.  Prescription sent. Begin Estrace  vaginal cream 2-3 times per week.  Prescription sent.  Instructions provided. Return to office in 6 weeks for re-evaluation. Consider daily prophylactic antibiotic pending results of culture.   Chief Complaint:  Chief Complaint  Patient presents with   Frequent UTI    History of Present Illness:  Beth Velez is a 62 y.o. adult who is seen in consultation from Joesph Cedar, PA-C for evaluation of frequent UTI's. She reports 3 UTIs in the past 6 months.  Typical UTI symptoms include frequency, urgency, and dysuria.  No fevers, chills, back pain, flank pain, or gross hematuria.  Her symptoms typically resolve with antibiotic therapy.  No association with intercourse as she is not sexually active. Her first U[facility].  No culture results available.  She was treated for UTIs in November 2025 and again in January 2026. Urine culture results: 12/08/23 10-50 K E. coli 02/04/2024 10-50 K E. coli  She was seen at urgent care yesterday for dysuria.  Urine culture pending.  No antibiotics prescribed.  She is status post a laparoscopic hysterectomy/BSO in 2008 for uterine prolapse.  She was previously on estradiol  which was discontinued 6 months ago.  No history of UTIs prior to 6 months ago. She is not having any problems with constipation or diarrhea.  She did have symptoms of frequency, nocturia, and urinary incontinence.  She was  evaluated by Dr[facility] Dorothyann Ku at [facility] in [facility] for possible prolapse.  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  She was again seen in 2020 with similar findings on exam.  Past Medical History:  Past Medical History:  Diagnosis Date   Diverticulitis     Past Surgical History:  Past Surgical History:  Procedure Laterality Date   ABDOMINAL HYSTERECTOMY     APPENDECTOMY     BACK SURGERY     TOE SURGERY Left    great toe    Allergies:  Allergies[1]  Family History:  Family History  Problem Relation Age of Onset   High Cholesterol Mother    Obesity Mother    Hydrocephalus Mother        normal pressure   Heart attack Father        slient   Dementia Father    Rheum arthritis Cousin     Social History:  Social History[2]  Review of symptoms:  Constitutional:  Negative for unexplained weight loss, night sweats, fever, chills ENT:  Negative for nose bleeds, sinus pain, painful swallowing CV:  Negative for chest pain, shortness of breath, exercise intolerance, palpitations, loss of consciousness Resp:  Negative for cough, wheezing, shortness of breath GI:  Negative for nausea, vomiting, diarrhea, bloody stools GU:  Positives noted in HPI; otherwise negative for gross hematuria, dysuria, urinary incontinence Neuro:  Negative for seizures, poor balance, limb weakness, slurred speech Psych:  Negative for lack of energy, depression, anxiety Endocrine:  Negative for polydipsia, polyuria, symptoms of hypoglycemia (dizziness, hunger,  sweating) Hematologic:  Negative for anemia, purpura, petechia, prolonged or excessive bleeding, use of anticoagulants  Allergic:  Negative for difficulty breathing or choking as a result of exposure to anything; no shellfish allergy; no allergic response (rash/itch) to materials, foods  Physical exam: BP (!) 153/99   Pulse (!) 112   Ht 5' 4 (1.626 m)   Wt 175 lb (79.4 kg)   BMI 30.04 kg/m  GENERAL APPEARANCE:  Well  appearing, well developed, well nourished, NAD HEENT: Atraumatic, Normocephalic, oropharynx clear. NECK: Supple without lymphadenopathy or thyromegaly. LUNGS: Clear to auscultation bilaterally. HEART: Regular Rate and Rhythm without murmurs, gallops, or rubs. ABDOMEN: Soft, non-tender, No Masses. EXTREMITIES: Moves all extremities well.  Without clubbing, cyanosis, or edema. NEUROLOGIC:  Alert and oriented x 3, normal gait, CN II-XII grossly intact.  MENTAL STATUS:  Appropriate. BACK:  Non-tender to palpation.  No CVAT SKIN:  Warm, dry and intact.    Results: U/A: >30 WBCs, 3-10 RBCs, many bacteria, nitrite negative  PVR =  0 ml     [1]  Allergies Allergen Reactions   Atorvastatin  Other (See Comments)    Other Reaction(s): Unknown  Other Reaction(s): Myalgias, Other (See Comments), Unknown    Other Reaction(s): Unknown    Other Reaction(s): Myalgias, Other (See Comments), Unknown  Other Reaction(s): Unknown  Other Reaction(s): Myalgias, Other (See Comments), Unknown  Other Reaction(s): Unknown    Other Reaction(s): Myalgias, Other (See Comments), Unknown  Other Reaction(s): Unknown  Other Reaction(s): Myalgias, Other (See Comments), Unknown  Other Reaction(s): Unknown    Other Reaction(s): Myalgias    Other Reaction(s): Myalgias, Other (See Comments), Unknown  Other Reaction(s): Unknown  Other Reaction(s): Myalgias, Other (See Comments), Unknown  Other Reaction(s): Unknown  Other Reaction(s): Myalgias, Other (See Comments), Unknown  Other Reaction(s): Unknown  Other Reaction(s): Myalgias, Other (See Comments), Unknown  Other Reaction(s): Unknown  Other Reaction(s): Myalgias, Other (See Comments), Unknown  Other Reaction(s): Unknown  Other Reaction(s): Myalgias, Other (See Comments), Unknown  Other Reaction(s): Unknown  Other Reaction(s): Myalgias  Other Reaction(s): Myalgias, Other (See Comments), Unknown  Other Reaction(s): Unknown  Other Reaction(s): Myalgias, Other (See  Comments), Unknown  Other Reaction(s): Unknown    Other Reaction(s): Myalgias, Other (See Comments), Unknown  Other Reaction(s): Unknown  Other Reaction(s): Myalgias, Other (See Comments), Unknown  Other Reaction(s): Unknown  Other Reaction(s): Myalgias, Other (See Comments), Unknown  Other Reaction(s): Unknown    Other Reaction(s): Myalgias, Other (See Comments), Unknown  Other Reaction(s): Unknown  [2]  Social History Tobacco Use   Smoking status: Never    Passive exposure: Never   Smokeless tobacco: Never  Vaping Use   Vaping status: Never Used  Substance Use Topics   Alcohol use: No   Drug use: No   "

## 2024-02-21 NOTE — Patient Instructions (Addendum)
 Odis Salines, MD  -  [facility] [phone]

## 2024-02-25 LAB — URINE CULTURE

## 2024-02-26 ENCOUNTER — Other Ambulatory Visit: Payer: Self-pay | Admitting: Urology

## 2024-02-26 ENCOUNTER — Ambulatory Visit: Payer: Self-pay | Admitting: Urology

## 2024-02-26 DIAGNOSIS — N390 Urinary tract infection, site not specified: Secondary | ICD-10-CM

## 2024-02-26 MED ORDER — NITROFURANTOIN MONOHYD MACRO 100 MG PO CAPS: 100.0000 mg | ORAL_CAPSULE | Freq: Every day | ORAL | 2 refills | Status: AC

## 2024-02-27 ENCOUNTER — Telehealth (HOSPITAL_BASED_OUTPATIENT_CLINIC_OR_DEPARTMENT_OTHER): Payer: Self-pay | Admitting: Orthopaedic Surgery

## 2024-02-27 NOTE — Telephone Encounter (Signed)
 Rickelle at [facility] (f) [phone]) (P) [phone] needs lab work related to patients referel sent over

## 2024-02-27 NOTE — Telephone Encounter (Signed)
 ANA and RFactor has been faxed to Rickell at [facility]

## 2024-02-29 NOTE — Telephone Encounter (Signed)
 The pharmacy has been notified of correct Rx information.

## 2024-03-03 ENCOUNTER — Ambulatory Visit: Payer: Medicare (Managed Care) | Admitting: Physician Assistant

## 2024-03-10 ENCOUNTER — Encounter: Payer: Medicare (Managed Care) | Admitting: Physical Medicine & Rehabilitation

## 2024-04-02 ENCOUNTER — Ambulatory Visit: Admitting: Family Medicine

## 2024-04-10 ENCOUNTER — Ambulatory Visit: Payer: Medicare (Managed Care) | Admitting: Urology

## 2024-07-15 ENCOUNTER — Ambulatory Visit: Admitting: Family Medicine
